# Patient Record
Sex: Female | Born: 1985
Health system: Southern US, Community
[De-identification: ages and names within clinical notes are randomized; demographics above are authoritative.]

## PROBLEM LIST (undated history)

## (undated) DIAGNOSIS — Z8611 Personal history of tuberculosis: Secondary | ICD-10-CM

## (undated) DIAGNOSIS — N838 Other noninflammatory disorders of ovary, fallopian tube and broad ligament: Secondary | ICD-10-CM

## (undated) DIAGNOSIS — N939 Abnormal uterine and vaginal bleeding, unspecified: Secondary | ICD-10-CM

## (undated) DIAGNOSIS — L910 Hypertrophic scar: Secondary | ICD-10-CM

## (undated) HISTORY — DX: Personal history of tuberculosis: Z86.11

## (undated) HISTORY — DX: Abnormal uterine and vaginal bleeding, unspecified: N93.9

## (undated) HISTORY — DX: Other noninflammatory disorders of ovary, fallopian tube and broad ligament: N83.8

---

## 2015-02-25 ENCOUNTER — Ambulatory Visit
Admission: RE | Admit: 2015-02-25 | Discharge: 2015-02-25 | Disposition: A | Discharge status: Home / Self Care | Payer: No Typology Code available for payment source | Source: Ambulatory Visit | Attending: Infectious Disease | Admitting: Infectious Disease

## 2015-02-25 ENCOUNTER — Other Ambulatory Visit: Payer: Self-pay | Admitting: Infectious Disease

## 2015-02-25 DIAGNOSIS — Z139 Encounter for screening, unspecified: Secondary | ICD-10-CM

## 2015-09-06 DIAGNOSIS — D069 Carcinoma in situ of cervix, unspecified: Secondary | ICD-10-CM

## 2015-09-06 HISTORY — DX: Carcinoma in situ of cervix, unspecified: D06.9

## 2015-09-23 HISTORY — PX: LEEP: SHX91

## 2018-10-03 DIAGNOSIS — B373 Candidiasis of vulva and vagina: Secondary | ICD-10-CM | POA: Diagnosis not present

## 2018-10-03 DIAGNOSIS — Z113 Encounter for screening for infections with a predominantly sexual mode of transmission: Secondary | ICD-10-CM | POA: Diagnosis not present

## 2018-10-03 DIAGNOSIS — M545 Low back pain: Secondary | ICD-10-CM | POA: Diagnosis not present

## 2018-10-03 DIAGNOSIS — N760 Acute vaginitis: Secondary | ICD-10-CM | POA: Diagnosis not present

## 2018-10-22 DIAGNOSIS — L910 Hypertrophic scar: Secondary | ICD-10-CM | POA: Diagnosis not present

## 2018-11-19 DIAGNOSIS — L910 Hypertrophic scar: Secondary | ICD-10-CM | POA: Diagnosis not present

## 2018-11-28 DIAGNOSIS — M545 Low back pain: Secondary | ICD-10-CM | POA: Diagnosis not present

## 2018-11-28 DIAGNOSIS — N925 Other specified irregular menstruation: Secondary | ICD-10-CM | POA: Diagnosis not present

## 2018-12-10 DIAGNOSIS — Z6828 Body mass index (BMI) 28.0-28.9, adult: Secondary | ICD-10-CM | POA: Diagnosis not present

## 2018-12-10 DIAGNOSIS — N926 Irregular menstruation, unspecified: Secondary | ICD-10-CM | POA: Diagnosis not present

## 2018-12-10 DIAGNOSIS — L910 Hypertrophic scar: Secondary | ICD-10-CM | POA: Diagnosis not present

## 2018-12-10 DIAGNOSIS — M545 Low back pain: Secondary | ICD-10-CM | POA: Diagnosis not present

## 2018-12-24 DIAGNOSIS — N926 Irregular menstruation, unspecified: Secondary | ICD-10-CM | POA: Diagnosis not present

## 2018-12-24 DIAGNOSIS — E039 Hypothyroidism, unspecified: Secondary | ICD-10-CM | POA: Diagnosis not present

## 2018-12-29 DIAGNOSIS — Z23 Encounter for immunization: Secondary | ICD-10-CM | POA: Diagnosis not present

## 2018-12-29 DIAGNOSIS — L910 Hypertrophic scar: Secondary | ICD-10-CM | POA: Diagnosis not present

## 2019-01-23 DIAGNOSIS — Z0000 Encounter for general adult medical examination without abnormal findings: Secondary | ICD-10-CM | POA: Diagnosis not present

## 2019-01-26 DIAGNOSIS — L910 Hypertrophic scar: Secondary | ICD-10-CM | POA: Diagnosis not present

## 2019-01-28 DIAGNOSIS — M549 Dorsalgia, unspecified: Secondary | ICD-10-CM | POA: Diagnosis not present

## 2019-02-04 DIAGNOSIS — M6289 Other specified disorders of muscle: Secondary | ICD-10-CM | POA: Diagnosis not present

## 2019-02-04 DIAGNOSIS — M6281 Muscle weakness (generalized): Secondary | ICD-10-CM | POA: Diagnosis not present

## 2019-02-04 DIAGNOSIS — M549 Dorsalgia, unspecified: Secondary | ICD-10-CM | POA: Diagnosis not present

## 2019-02-04 DIAGNOSIS — Z5189 Encounter for other specified aftercare: Secondary | ICD-10-CM | POA: Diagnosis not present

## 2019-02-18 DIAGNOSIS — M9903 Segmental and somatic dysfunction of lumbar region: Secondary | ICD-10-CM | POA: Diagnosis not present

## 2019-02-18 DIAGNOSIS — M9904 Segmental and somatic dysfunction of sacral region: Secondary | ICD-10-CM | POA: Diagnosis not present

## 2019-02-18 DIAGNOSIS — M5117 Intervertebral disc disorders with radiculopathy, lumbosacral region: Secondary | ICD-10-CM | POA: Diagnosis not present

## 2019-02-18 DIAGNOSIS — M9906 Segmental and somatic dysfunction of lower extremity: Secondary | ICD-10-CM | POA: Diagnosis not present

## 2019-02-18 DIAGNOSIS — M5416 Radiculopathy, lumbar region: Secondary | ICD-10-CM | POA: Diagnosis not present

## 2019-02-25 DIAGNOSIS — M5117 Intervertebral disc disorders with radiculopathy, lumbosacral region: Secondary | ICD-10-CM | POA: Diagnosis not present

## 2019-02-25 DIAGNOSIS — M5416 Radiculopathy, lumbar region: Secondary | ICD-10-CM | POA: Diagnosis not present

## 2019-02-25 DIAGNOSIS — M9904 Segmental and somatic dysfunction of sacral region: Secondary | ICD-10-CM | POA: Diagnosis not present

## 2019-02-25 DIAGNOSIS — M9906 Segmental and somatic dysfunction of lower extremity: Secondary | ICD-10-CM | POA: Diagnosis not present

## 2019-02-25 DIAGNOSIS — M9903 Segmental and somatic dysfunction of lumbar region: Secondary | ICD-10-CM | POA: Diagnosis not present

## 2019-02-26 DIAGNOSIS — L910 Hypertrophic scar: Secondary | ICD-10-CM | POA: Diagnosis not present

## 2019-02-26 DIAGNOSIS — S37099A Other injury of unspecified kidney, initial encounter: Secondary | ICD-10-CM | POA: Diagnosis not present

## 2019-03-04 DIAGNOSIS — M9906 Segmental and somatic dysfunction of lower extremity: Secondary | ICD-10-CM | POA: Diagnosis not present

## 2019-03-04 DIAGNOSIS — M5117 Intervertebral disc disorders with radiculopathy, lumbosacral region: Secondary | ICD-10-CM | POA: Diagnosis not present

## 2019-03-04 DIAGNOSIS — M9903 Segmental and somatic dysfunction of lumbar region: Secondary | ICD-10-CM | POA: Diagnosis not present

## 2019-03-04 DIAGNOSIS — M5416 Radiculopathy, lumbar region: Secondary | ICD-10-CM | POA: Diagnosis not present

## 2019-03-04 DIAGNOSIS — M9904 Segmental and somatic dysfunction of sacral region: Secondary | ICD-10-CM | POA: Diagnosis not present

## 2019-03-11 DIAGNOSIS — M5117 Intervertebral disc disorders with radiculopathy, lumbosacral region: Secondary | ICD-10-CM | POA: Diagnosis not present

## 2019-03-11 DIAGNOSIS — M9903 Segmental and somatic dysfunction of lumbar region: Secondary | ICD-10-CM | POA: Diagnosis not present

## 2019-03-11 DIAGNOSIS — M5416 Radiculopathy, lumbar region: Secondary | ICD-10-CM | POA: Diagnosis not present

## 2019-03-11 DIAGNOSIS — M9904 Segmental and somatic dysfunction of sacral region: Secondary | ICD-10-CM | POA: Diagnosis not present

## 2019-03-11 DIAGNOSIS — M9906 Segmental and somatic dysfunction of lower extremity: Secondary | ICD-10-CM | POA: Diagnosis not present

## 2019-03-18 DIAGNOSIS — M5416 Radiculopathy, lumbar region: Secondary | ICD-10-CM | POA: Diagnosis not present

## 2019-03-18 DIAGNOSIS — M9903 Segmental and somatic dysfunction of lumbar region: Secondary | ICD-10-CM | POA: Diagnosis not present

## 2019-03-18 DIAGNOSIS — M9904 Segmental and somatic dysfunction of sacral region: Secondary | ICD-10-CM | POA: Diagnosis not present

## 2019-03-18 DIAGNOSIS — M5117 Intervertebral disc disorders with radiculopathy, lumbosacral region: Secondary | ICD-10-CM | POA: Diagnosis not present

## 2019-03-18 DIAGNOSIS — M9906 Segmental and somatic dysfunction of lower extremity: Secondary | ICD-10-CM | POA: Diagnosis not present

## 2019-04-01 DIAGNOSIS — M9906 Segmental and somatic dysfunction of lower extremity: Secondary | ICD-10-CM | POA: Diagnosis not present

## 2019-04-01 DIAGNOSIS — M5117 Intervertebral disc disorders with radiculopathy, lumbosacral region: Secondary | ICD-10-CM | POA: Diagnosis not present

## 2019-04-01 DIAGNOSIS — M9903 Segmental and somatic dysfunction of lumbar region: Secondary | ICD-10-CM | POA: Diagnosis not present

## 2019-04-01 DIAGNOSIS — M5416 Radiculopathy, lumbar region: Secondary | ICD-10-CM | POA: Diagnosis not present

## 2019-04-01 DIAGNOSIS — M9904 Segmental and somatic dysfunction of sacral region: Secondary | ICD-10-CM | POA: Diagnosis not present

## 2019-04-08 DIAGNOSIS — M9904 Segmental and somatic dysfunction of sacral region: Secondary | ICD-10-CM | POA: Diagnosis not present

## 2019-04-08 DIAGNOSIS — M9903 Segmental and somatic dysfunction of lumbar region: Secondary | ICD-10-CM | POA: Diagnosis not present

## 2019-04-08 DIAGNOSIS — M5117 Intervertebral disc disorders with radiculopathy, lumbosacral region: Secondary | ICD-10-CM | POA: Diagnosis not present

## 2019-04-08 DIAGNOSIS — M5416 Radiculopathy, lumbar region: Secondary | ICD-10-CM | POA: Diagnosis not present

## 2019-04-08 DIAGNOSIS — M9906 Segmental and somatic dysfunction of lower extremity: Secondary | ICD-10-CM | POA: Diagnosis not present

## 2019-04-15 DIAGNOSIS — M5416 Radiculopathy, lumbar region: Secondary | ICD-10-CM | POA: Diagnosis not present

## 2019-04-15 DIAGNOSIS — M9903 Segmental and somatic dysfunction of lumbar region: Secondary | ICD-10-CM | POA: Diagnosis not present

## 2019-04-15 DIAGNOSIS — M9906 Segmental and somatic dysfunction of lower extremity: Secondary | ICD-10-CM | POA: Diagnosis not present

## 2019-04-15 DIAGNOSIS — M5117 Intervertebral disc disorders with radiculopathy, lumbosacral region: Secondary | ICD-10-CM | POA: Diagnosis not present

## 2019-04-15 DIAGNOSIS — M9904 Segmental and somatic dysfunction of sacral region: Secondary | ICD-10-CM | POA: Diagnosis not present

## 2019-04-22 DIAGNOSIS — M9903 Segmental and somatic dysfunction of lumbar region: Secondary | ICD-10-CM | POA: Diagnosis not present

## 2019-04-22 DIAGNOSIS — M5416 Radiculopathy, lumbar region: Secondary | ICD-10-CM | POA: Diagnosis not present

## 2019-04-22 DIAGNOSIS — M9904 Segmental and somatic dysfunction of sacral region: Secondary | ICD-10-CM | POA: Diagnosis not present

## 2019-04-22 DIAGNOSIS — M5117 Intervertebral disc disorders with radiculopathy, lumbosacral region: Secondary | ICD-10-CM | POA: Diagnosis not present

## 2019-04-22 DIAGNOSIS — M9906 Segmental and somatic dysfunction of lower extremity: Secondary | ICD-10-CM | POA: Diagnosis not present

## 2019-04-29 DIAGNOSIS — M9906 Segmental and somatic dysfunction of lower extremity: Secondary | ICD-10-CM | POA: Diagnosis not present

## 2019-04-29 DIAGNOSIS — M5416 Radiculopathy, lumbar region: Secondary | ICD-10-CM | POA: Diagnosis not present

## 2019-04-29 DIAGNOSIS — M9903 Segmental and somatic dysfunction of lumbar region: Secondary | ICD-10-CM | POA: Diagnosis not present

## 2019-04-29 DIAGNOSIS — M5117 Intervertebral disc disorders with radiculopathy, lumbosacral region: Secondary | ICD-10-CM | POA: Diagnosis not present

## 2019-04-29 DIAGNOSIS — M9904 Segmental and somatic dysfunction of sacral region: Secondary | ICD-10-CM | POA: Diagnosis not present

## 2019-05-11 DIAGNOSIS — L910 Hypertrophic scar: Secondary | ICD-10-CM | POA: Diagnosis not present

## 2019-05-11 DIAGNOSIS — S37091A Other injury of right kidney, initial encounter: Secondary | ICD-10-CM | POA: Diagnosis not present

## 2019-06-17 DIAGNOSIS — M5416 Radiculopathy, lumbar region: Secondary | ICD-10-CM | POA: Diagnosis not present

## 2019-06-17 DIAGNOSIS — M5117 Intervertebral disc disorders with radiculopathy, lumbosacral region: Secondary | ICD-10-CM | POA: Diagnosis not present

## 2019-06-17 DIAGNOSIS — M9903 Segmental and somatic dysfunction of lumbar region: Secondary | ICD-10-CM | POA: Diagnosis not present

## 2019-06-17 DIAGNOSIS — M9904 Segmental and somatic dysfunction of sacral region: Secondary | ICD-10-CM | POA: Diagnosis not present

## 2019-06-17 DIAGNOSIS — M9906 Segmental and somatic dysfunction of lower extremity: Secondary | ICD-10-CM | POA: Diagnosis not present

## 2019-06-22 DIAGNOSIS — M5416 Radiculopathy, lumbar region: Secondary | ICD-10-CM | POA: Diagnosis not present

## 2019-06-22 DIAGNOSIS — M9904 Segmental and somatic dysfunction of sacral region: Secondary | ICD-10-CM | POA: Diagnosis not present

## 2019-06-22 DIAGNOSIS — M9903 Segmental and somatic dysfunction of lumbar region: Secondary | ICD-10-CM | POA: Diagnosis not present

## 2019-06-22 DIAGNOSIS — M9906 Segmental and somatic dysfunction of lower extremity: Secondary | ICD-10-CM | POA: Diagnosis not present

## 2019-06-22 DIAGNOSIS — M5117 Intervertebral disc disorders with radiculopathy, lumbosacral region: Secondary | ICD-10-CM | POA: Diagnosis not present

## 2019-06-24 DIAGNOSIS — L910 Hypertrophic scar: Secondary | ICD-10-CM | POA: Diagnosis not present

## 2019-06-29 DIAGNOSIS — M5117 Intervertebral disc disorders with radiculopathy, lumbosacral region: Secondary | ICD-10-CM | POA: Diagnosis not present

## 2019-06-29 DIAGNOSIS — M9906 Segmental and somatic dysfunction of lower extremity: Secondary | ICD-10-CM | POA: Diagnosis not present

## 2019-06-29 DIAGNOSIS — M9903 Segmental and somatic dysfunction of lumbar region: Secondary | ICD-10-CM | POA: Diagnosis not present

## 2019-06-29 DIAGNOSIS — M9904 Segmental and somatic dysfunction of sacral region: Secondary | ICD-10-CM | POA: Diagnosis not present

## 2019-06-29 DIAGNOSIS — M5416 Radiculopathy, lumbar region: Secondary | ICD-10-CM | POA: Diagnosis not present

## 2019-08-27 DIAGNOSIS — L910 Hypertrophic scar: Secondary | ICD-10-CM | POA: Diagnosis not present

## 2019-09-24 DIAGNOSIS — L910 Hypertrophic scar: Secondary | ICD-10-CM | POA: Diagnosis not present

## 2019-10-16 DIAGNOSIS — M25551 Pain in right hip: Secondary | ICD-10-CM | POA: Diagnosis not present

## 2019-10-16 DIAGNOSIS — M5417 Radiculopathy, lumbosacral region: Secondary | ICD-10-CM | POA: Diagnosis not present

## 2019-10-21 DIAGNOSIS — M545 Low back pain: Secondary | ICD-10-CM | POA: Diagnosis not present

## 2019-10-24 DIAGNOSIS — M545 Low back pain: Secondary | ICD-10-CM | POA: Diagnosis not present

## 2019-10-27 ENCOUNTER — Other Ambulatory Visit: Payer: Self-pay | Admitting: Family Medicine

## 2019-10-27 DIAGNOSIS — M545 Low back pain, unspecified: Secondary | ICD-10-CM

## 2019-10-30 ENCOUNTER — Ambulatory Visit
Admission: RE | Admit: 2019-10-30 | Discharge: 2019-10-30 | Disposition: A | Discharge status: Home / Self Care | Payer: BC Managed Care – PPO | Source: Ambulatory Visit | Attending: Family Medicine | Admitting: Family Medicine

## 2019-10-30 DIAGNOSIS — M545 Low back pain, unspecified: Secondary | ICD-10-CM

## 2019-10-30 DIAGNOSIS — N83201 Unspecified ovarian cyst, right side: Secondary | ICD-10-CM | POA: Diagnosis not present

## 2019-10-30 DIAGNOSIS — J479 Bronchiectasis, uncomplicated: Secondary | ICD-10-CM | POA: Diagnosis not present

## 2019-10-30 DIAGNOSIS — Q631 Lobulated, fused and horseshoe kidney: Secondary | ICD-10-CM | POA: Diagnosis not present

## 2019-10-30 DIAGNOSIS — N838 Other noninflammatory disorders of ovary, fallopian tube and broad ligament: Secondary | ICD-10-CM | POA: Diagnosis not present

## 2019-10-30 MED ORDER — IOPAMIDOL (ISOVUE-300) INJECTION 61%
100.0000 mL | Freq: Once | INTRAVENOUS | Status: AC | PRN
  Administered 2019-10-30: 100 mL via INTRAVENOUS

## 2019-11-06 ENCOUNTER — Other Ambulatory Visit: Payer: Self-pay

## 2019-11-06 DIAGNOSIS — L910 Hypertrophic scar: Secondary | ICD-10-CM | POA: Diagnosis not present

## 2019-11-11 DIAGNOSIS — M545 Low back pain, unspecified: Secondary | ICD-10-CM | POA: Diagnosis not present

## 2019-11-24 DIAGNOSIS — N926 Irregular menstruation, unspecified: Secondary | ICD-10-CM | POA: Diagnosis not present

## 2019-11-24 DIAGNOSIS — N83292 Other ovarian cyst, left side: Secondary | ICD-10-CM | POA: Diagnosis not present

## 2019-12-02 DIAGNOSIS — R9341 Abnormal radiologic findings on diagnostic imaging of renal pelvis, ureter, or bladder: Secondary | ICD-10-CM | POA: Diagnosis not present

## 2019-12-04 DIAGNOSIS — L910 Hypertrophic scar: Secondary | ICD-10-CM | POA: Diagnosis not present

## 2019-12-15 DIAGNOSIS — N838 Other noninflammatory disorders of ovary, fallopian tube and broad ligament: Secondary | ICD-10-CM | POA: Diagnosis not present

## 2019-12-15 DIAGNOSIS — N83209 Unspecified ovarian cyst, unspecified side: Secondary | ICD-10-CM | POA: Diagnosis not present

## 2019-12-15 DIAGNOSIS — E039 Hypothyroidism, unspecified: Secondary | ICD-10-CM | POA: Diagnosis not present

## 2019-12-16 DIAGNOSIS — M545 Low back pain, unspecified: Secondary | ICD-10-CM | POA: Diagnosis not present

## 2019-12-16 DIAGNOSIS — M25551 Pain in right hip: Secondary | ICD-10-CM | POA: Diagnosis not present

## 2019-12-16 DIAGNOSIS — Z1329 Encounter for screening for other suspected endocrine disorder: Secondary | ICD-10-CM | POA: Diagnosis not present

## 2019-12-18 DIAGNOSIS — R772 Abnormality of alphafetoprotein: Secondary | ICD-10-CM

## 2019-12-18 DIAGNOSIS — N838 Other noninflammatory disorders of ovary, fallopian tube and broad ligament: Secondary | ICD-10-CM

## 2019-12-21 ENCOUNTER — Encounter: Payer: Self-pay | Admitting: Gynecologic Oncology

## 2019-12-21 ENCOUNTER — Inpatient Hospital Stay (HOSPITAL_BASED_OUTPATIENT_CLINIC_OR_DEPARTMENT_OTHER): Payer: BC Managed Care – PPO | Admitting: Gynecologic Oncology

## 2019-12-21 ENCOUNTER — Other Ambulatory Visit (HOSPITAL_COMMUNITY)
Admission: RE | Admit: 2019-12-21 | Discharge: 2019-12-21 | Disposition: A | Discharge status: Home / Self Care | Payer: BC Managed Care – PPO | Source: Ambulatory Visit | Attending: Gynecologic Oncology | Admitting: Gynecologic Oncology

## 2019-12-21 ENCOUNTER — Inpatient Hospital Stay: Payer: BC Managed Care – PPO | Attending: Gynecologic Oncology

## 2019-12-21 ENCOUNTER — Other Ambulatory Visit: Payer: Self-pay

## 2019-12-21 VITALS — BP 125/78 | HR 95 | Temp 95.3°F | Resp 18 | Ht 64.0 in | Wt 164.0 lb

## 2019-12-21 DIAGNOSIS — M549 Dorsalgia, unspecified: Secondary | ICD-10-CM

## 2019-12-21 DIAGNOSIS — Z124 Encounter for screening for malignant neoplasm of cervix: Secondary | ICD-10-CM | POA: Insufficient documentation

## 2019-12-21 DIAGNOSIS — N838 Other noninflammatory disorders of ovary, fallopian tube and broad ligament: Secondary | ICD-10-CM | POA: Diagnosis not present

## 2019-12-21 DIAGNOSIS — R772 Abnormality of alphafetoprotein: Secondary | ICD-10-CM | POA: Insufficient documentation

## 2019-12-21 DIAGNOSIS — Z8611 Personal history of tuberculosis: Secondary | ICD-10-CM

## 2019-12-21 DIAGNOSIS — R102 Pelvic and perineal pain: Secondary | ICD-10-CM

## 2019-12-21 DIAGNOSIS — D3911 Neoplasm of uncertain behavior of right ovary: Secondary | ICD-10-CM

## 2019-12-21 DIAGNOSIS — G8929 Other chronic pain: Secondary | ICD-10-CM | POA: Insufficient documentation

## 2019-12-21 LAB — LACTATE DEHYDROGENASE: LDH: 191 U/L (ref 98–192)

## 2019-12-21 NOTE — Patient Instructions (Signed)
I will call you with the lab tests from today as well as your pap test once those results are back.  We will send a referral requesting a pelvic ultrasound to the clinic that you designate in [place] while you are there for the holidays.  I'd like to talk again after you are back (and hopefully can drop off the records from your ultrasound) regarding your desire to move forward with pregnancy based on any possible change in the mass on your right ovary on your follow-up ultrasound.  If you have any questions, please call the clinic at [phone].

## 2019-12-21 NOTE — Progress Notes (Signed)
GYNECOLOGIC ONCOLOGY NEW PATIENT CONSULTATION   Patient Name: Monica Bradley  Patient Age: 34 y.o. Date of Service: 12/21/19 Referring Provider: Dr. Murrell Redden  Primary Care Provider: Patient, No Pcp Per Consulting Provider: Jeral Pinch, MD   Assessment/Plan:  Premenopausal patient with elevated AFP and small solid-appearing adnexal mass.  We discussed findings on her ultrasound and CT scan. She has a solid-appearing nodule within the right ovary on recent ultrasound, also seen on her CT scan. Given the size of this lesion (less than 2 cm), this would not typically be responsible for the back and pelvic pain that she has been having. We reviewed the features on ultrasound that raise concern for the possibility of a malignant process, especially in the setting of an elevated AFP. AFP is a tumor marker that can be elevated in germ cell tumors and, less commonly, Sertoli-Leydig tumors. The patient expressed some concern with the lab test from her recent visit. She has had her TSH checked twice at this office and it has been found to be elevated. Close follow-up with her PCP found the TSH to be normal. She request that the AFP be repeated. I will plan to also get an LDH today.  Surgery to excise this lesion, ideally with a cystectomy, would offer the only way to definitively rule out a malignant process. We discussed that this could be done robotically and the mass could be sent for frozen section to help guide additional treatment. Even in the setting of a malignancy, often, fertility-sparing staging can be performed. We discussed that this may entail lymph node sampling, omentectomy, and peritoneal biopsies. Depending on the appearance of the the right ovary, and my ability to safely perform cystectomy, removal of the entire ovary may be necessary or indicated. During this discussion, the patient adamantly opposed to removal of her ovary. While I offered cystectomy with the understanding of risk of  dissemination if this is a malignant process, I cannot assure without any doubt that the entire ovary would not be removed. There is always a risk in the setting of cystectomy that bleeding from the ovarian parenchyma may require removal of that ovary. Given her very strong desires to become pregnant in the near future, the patient does not want to risk losing 1 ovary. We discussed that many patients are able to achieve pregnancy with one ovary in situ.  We will plan to repeat her AFP and get an LDH today. If her AFP is in normal range, I will have very low suspicion for malignancy. I recommend regardless of the AFP, given the patient's reluctance to move forward with surgery, that we repeat pelvic ultrasound in 6-8 weeks. The patient is going to be traveling to [place] for the holidays, where she has some family. She will provide [place] with the name and number of a clinic where she has received OB/GYN care before so that we may send a referral to hopefully get an ultrasound scheduled while she is there. I have asked her to bring a copy of the ultrasound report as well as images, if possible, and to drop these off when she returns to [place]. If the mass has grown and/or looks more concerning, I will recommend surgery. If stable, the patient's preference is to attempt pregnancy. She is willing to defer attempting pregnancy until we get the results of a repeat ultrasound.  Given her history of cervical dysplasia with prior excisional procedure, Pap test and HPV testing performed today.  A copy of this note was  sent to the patient's referring provider.   65 minutes of total time was spent for this patient encounter, including preparation, face-to-face counseling with the patient and coordination of care, and documentation of the encounter.  Jeral Pinch, MD  Division of Gynecologic Oncology  Department of Obstetrics and Gynecology  [facility]   ___________________________________________  Chief Complaint: Chief Complaint  Patient presents with  . Ovarian mass, right  . Elevated AFP    History of Present Illness:  Monica Bradley is a 34 y.o. y.o. female who is seen in consultation at the request of Dr. Murrell Redden for an evaluation of almost a 2-year history of right-sided back and pelvic pain now with findings of a small solid right adnexal mass.  Patient reports that beginning in February 2020, shortly after a miscarriage (TAB, treated with medication), she began having right sided low back pain. She would have difficulty standing for more than 5 minutes. She additionally endorsed feeling pelvic pressure. She had virtual physical therapy and saw a chiropractor, which helped with her back pain and with walking. Now, she notes feeling "pressure" or "pinching" when she is sitting. If she sits for more than 15 minutes, she feels that her pelvis "gets inflamed". Sometimes she has right-sided back and flank pain, sometimes the pain is in her right pelvis anteriorly. When she wakes up in the morning, this area often feels "tight". She sometimes struggles to put on her shoes. Stretching seems to help. [facility] and given ibuprofen and a muscle relaxer. These medications help with some movements, especially turning, but the symptoms are still present. At the time that the pain started in early 2020, her menses after the miscarriage was abnormal. She would have longer length of time between menses. Her menses normalized after she stopped some of the medications that she was taken. Her pain continues to be worse during her menses. She is a sexually active at this time intermittently and denies any dyspareunia.  She reports having a good appetite without nausea or emesis. She reports normal bowel and bladder function.  She has a history of high-grade cervical dysplasia treated with a LEEP in 2017. Her 78-month  follow-up Pap smear after the procedure was normal. Her second and last Pap smear was in 2019 and normal per her report.  PAST MEDICAL HISTORY:  Past Medical History:  Diagnosis Date  . Abnormal uterine bleeding (AUB)   . CIN III (cervical intraepithelial neoplasia III) 09/2015  . History of TB (tuberculosis)   . Ovarian mass, right      PAST SURGICAL HISTORY:  Past Surgical History:  Procedure Laterality Date  . LEEP  09/23/2015    OB/GYN HISTORY:  OB History  Gravida Para Term Preterm AB Living  2       2    SAB TAB Ectopic Multiple Live Births               # Outcome Date GA Lbr Len/2nd Weight Sex Delivery Anes PTL Lv  2 AB           1 AB             No LMP recorded.  Age at menarche: 25 Age at menopause: n/a Last pap: 2019 History of abnormal pap smears: yes, see HPI  SCREENING STUDIES:  Last mammogram: n/a  Last colonoscopy: n/a  MEDICATIONS: Outpatient Encounter Medications as of 12/21/2019  Medication Sig  . Prenatal Vit-Fe Fumarate-FA (MULTIVITAMIN-PRENATAL)  27-0.8 MG TABS tablet Take 1 tablet by mouth daily at 12 noon.  . folic acid (FOLVITE) 1 MG tablet Take 1 mg by mouth daily. (Patient not taking: Reported on 12/21/2019)   No facility-administered encounter medications on file as of 12/21/2019.    ALLERGIES:  No Known Allergies   FAMILY HISTORY:  Family History  Problem Relation Age of Onset  . Breast cancer Neg Hx   . Ovarian cancer Neg Hx   . Uterine cancer Neg Hx   . Colon cancer Neg Hx      SOCIAL HISTORY:    Social Connections:   . Frequency of Communication with Friends and Family: Not on file  . Frequency of Social Gatherings with Friends and Family: Not on file  . Attends Religious Services: Not on file  . Active Member of Clubs or Organizations: Not on file  . Attends Archivist Meetings: Not on file  . Marital Status: Not on file    REVIEW OF SYSTEMS:  Pertinent positives as per HPI. Denies appetite changes,  fevers, chills, fatigue, unexplained weight changes. Denies hearing loss, neck lumps or masses, mouth sores, ringing in ears or voice changes. Denies cough or wheezing.  Denies shortness of breath. Denies chest pain or palpitations. Denies leg swelling. Denies abdominal distention, blood in stools, constipation, diarrhea, nausea, vomiting, or early satiety. Denies pain with intercourse, dysuria, frequency, hematuria or incontinence. Denies hot flashes, vaginal bleeding or vaginal discharge.   Denies joint pain. Denies itching, rash, or wounds. Denies dizziness, headaches, numbness or seizures. Denies swollen lymph nodes or glands, denies easy bruising or bleeding. Denies anxiety, depression, confusion, or decreased concentration.  Physical Exam:  Vital Signs for this encounter:  Blood pressure 125/78, pulse 95, temperature (!) 95.3 F (35.2 C), temperature source Tympanic, resp. rate 18, height 5\' 4"  (1.626 m), weight 164 lb (74.4 kg), SpO2 100 %. Body mass index is 28.15 kg/m. General: Alert, oriented, no acute distress.  HEENT: Normocephalic, atraumatic. Sclera anicteric.  Chest: Unlabored breathing on room air. Abdomen: Normoactive bowel sounds. Soft, nondistended, nontender to palpation. No masses or hepatosplenomegaly appreciated. No palpable fluid wave.  Extremities: Grossly normal range of motion. Warm, well perfused. No edema bilaterally.  Skin: No rashes or lesions.  Lymphatics: No cervical, supraclavicular, or inguinal adenopathy.  GU:  Normal external female genitalia. No lesions. No discharge or bleeding.             Bladder/urethra:  No lesions or masses, well supported bladder             Vagina: Well rugated, no lesions or masses.             Cervix: Normal appearing, no lesions.  Pap test and HPV testing collected.             Uterus: Small, mobile, no parametrial involvement or nodularity.             Adnexa: No masses appreciated.  LABORATORY AND RADIOLOGIC DATA:   Outside medical records were reviewed to synthesize the above history, along with the history and physical obtained during the visit.   TSH on 10/19: 5.13  Tumor markers from 12/15/2019: CA-125: 19.9 bHCG: < 1 LDH: Not available AFP: 13.5 Estradiol: 194 Testosterone: 18 Inhibin A: 15.8  CT A/P on 9/24: 1. Possible mild bladder wall thickening. Correlate with urinalysis. 2. There is a 1.4 cm enhancing nodule in the right ovary. Given the patient's history of right-sided abdominal pain, follow-up with a pelvic  ultrasound is recommended. 3. 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 No hydronephrosis or radiopaque kidney stones. 4. Small volume of pelvic free fluid is likely physiologic. 5. Again noted is significant right lung volume loss as was previously described in 2017. 6. Questionable septate uterus. Attention on follow-up pelvic ultrasound is recommended.  Pelvic ultrasound exam on 11/9: Right ovary measures 2.8 x 2.4 x 1.7 cm with a solid-appearing lesion with internal vascularity measuring 1.5 x 1.4 x 1.6 cm.  Simple left ovarian cyst measuring up to 4.8 cm.

## 2019-12-22 ENCOUNTER — Telehealth: Payer: Self-pay | Admitting: Gynecologic Oncology

## 2019-12-22 ENCOUNTER — Other Ambulatory Visit: Payer: Self-pay | Admitting: Gynecologic Oncology

## 2019-12-22 DIAGNOSIS — N838 Other noninflammatory disorders of ovary, fallopian tube and broad ligament: Secondary | ICD-10-CM

## 2019-12-22 LAB — AFP TUMOR MARKER: AFP, Serum, Tumor Marker: 14.4 ng/mL — ABNORMAL HIGH (ref 0.0–8.3)

## 2019-12-22 NOTE — Telephone Encounter (Signed)
Called the patient to discuss lab test from yesterday.  LDH came back within normal limits.  AFP continues to be high, slightly increased from last value although this could be within lab error. I voiced my concerns that in the setting of a known adnexal mass, while not diagnostic, and increased AFP makes me concerned about a germ cell tumor.  The only way to definitively rule this out is surgically.  The patient understands my concern.  Her preference is still to wait on surgery and she would like to get a follow-up ultrasound in December while she is home in [place].  She will get these results to me when she is back in town and we will plan the next step based on a conversation once I have seen her follow-up ultrasound.  Jeral Pinch MD Gynecologic Oncology

## 2019-12-28 ENCOUNTER — Telehealth: Payer: Self-pay | Admitting: *Deleted

## 2019-12-28 LAB — CYTOLOGY - PAP: Diagnosis: NEGATIVE

## 2019-12-28 NOTE — Telephone Encounter (Signed)
Per patient request tried multiple attempts to reach [facility], left several messages with no return cal. Called the patient and explained the that you called back. Patient request that we try to have the scan done at [facility].

## 2019-12-29 ENCOUNTER — Telehealth: Payer: Self-pay

## 2019-12-29 NOTE — Telephone Encounter (Signed)
Per patient request called [facility], they don't take out of state orders. Per Dr Berline Lopes patient can have 6 weeks after last scan on 11/9. (patient to have done before and after her return)  Called and left the patient a message to call the office back

## 2019-12-29 NOTE — Telephone Encounter (Signed)
Pt notified about pap results: negative.  No questions or concerns voiced. 

## 2019-12-29 NOTE — Telephone Encounter (Signed)
Called the patient and explained that [facility] will not take out of state orders. Explained that the office will continue to look for a place for the scan, and asked the patient do the same.

## 2019-12-30 NOTE — Telephone Encounter (Signed)
Patient called back and requested to alaso have her lab work redrawn in [place]. Explained that the doctor will be back on Monday and I will call her once the orders are ready for pick up

## 2019-12-30 NOTE — Telephone Encounter (Signed)
Patient called back and stated "I called [facility] and they can do the [place]. They just need me to have the signed order with me." Explained that the order will be at the front desk for her.

## 2020-01-04 ENCOUNTER — Other Ambulatory Visit: Payer: Self-pay | Admitting: Gynecologic Oncology

## 2020-01-04 ENCOUNTER — Telehealth: Payer: Self-pay | Admitting: *Deleted

## 2020-01-04 DIAGNOSIS — N838 Other noninflammatory disorders of ovary, fallopian tube and broad ligament: Secondary | ICD-10-CM

## 2020-01-04 DIAGNOSIS — R772 Abnormality of alphafetoprotein: Secondary | ICD-10-CM

## 2020-01-04 NOTE — Telephone Encounter (Signed)
Notified the patient that Dr Berline Lopes gave the order to have lab draw in [place]. Orders at front desk for pick up

## 2020-01-06 DIAGNOSIS — Z23 Encounter for immunization: Secondary | ICD-10-CM | POA: Diagnosis not present

## 2020-01-07 DIAGNOSIS — L910 Hypertrophic scar: Secondary | ICD-10-CM | POA: Diagnosis not present

## 2020-01-08 ENCOUNTER — Telehealth: Payer: Self-pay

## 2020-01-08 DIAGNOSIS — F439 Reaction to severe stress, unspecified: Secondary | ICD-10-CM | POA: Diagnosis not present

## 2020-01-08 NOTE — Telephone Encounter (Signed)
Told Monica Bradley that the labs AFP tumor marker, LDH, and Pap Smear results will be up front in a white envevelope with her name on it to pick up.

## 2020-01-13 DIAGNOSIS — M5416 Radiculopathy, lumbar region: Secondary | ICD-10-CM | POA: Diagnosis not present

## 2020-01-13 DIAGNOSIS — M9906 Segmental and somatic dysfunction of lower extremity: Secondary | ICD-10-CM | POA: Diagnosis not present

## 2020-01-13 DIAGNOSIS — M9904 Segmental and somatic dysfunction of sacral region: Secondary | ICD-10-CM | POA: Diagnosis not present

## 2020-01-13 DIAGNOSIS — M9903 Segmental and somatic dysfunction of lumbar region: Secondary | ICD-10-CM | POA: Diagnosis not present

## 2020-01-13 DIAGNOSIS — M5117 Intervertebral disc disorders with radiculopathy, lumbosacral region: Secondary | ICD-10-CM | POA: Diagnosis not present

## 2020-01-18 DIAGNOSIS — M9903 Segmental and somatic dysfunction of lumbar region: Secondary | ICD-10-CM | POA: Diagnosis not present

## 2020-01-18 DIAGNOSIS — M9904 Segmental and somatic dysfunction of sacral region: Secondary | ICD-10-CM | POA: Diagnosis not present

## 2020-01-18 DIAGNOSIS — M5117 Intervertebral disc disorders with radiculopathy, lumbosacral region: Secondary | ICD-10-CM | POA: Diagnosis not present

## 2020-01-18 DIAGNOSIS — M5416 Radiculopathy, lumbar region: Secondary | ICD-10-CM | POA: Diagnosis not present

## 2020-01-21 DIAGNOSIS — L910 Hypertrophic scar: Secondary | ICD-10-CM | POA: Diagnosis not present

## 2020-01-22 DIAGNOSIS — N838 Other noninflammatory disorders of ovary, fallopian tube and broad ligament: Secondary | ICD-10-CM | POA: Diagnosis not present

## 2020-01-25 DIAGNOSIS — M9903 Segmental and somatic dysfunction of lumbar region: Secondary | ICD-10-CM | POA: Diagnosis not present

## 2020-01-25 DIAGNOSIS — M9904 Segmental and somatic dysfunction of sacral region: Secondary | ICD-10-CM | POA: Diagnosis not present

## 2020-01-25 DIAGNOSIS — M9906 Segmental and somatic dysfunction of lower extremity: Secondary | ICD-10-CM | POA: Diagnosis not present

## 2020-01-25 DIAGNOSIS — M5117 Intervertebral disc disorders with radiculopathy, lumbosacral region: Secondary | ICD-10-CM | POA: Diagnosis not present

## 2020-01-25 DIAGNOSIS — M5416 Radiculopathy, lumbar region: Secondary | ICD-10-CM | POA: Diagnosis not present

## 2020-02-01 DIAGNOSIS — N83209 Unspecified ovarian cyst, unspecified side: Secondary | ICD-10-CM | POA: Diagnosis not present

## 2020-02-03 DIAGNOSIS — M5416 Radiculopathy, lumbar region: Secondary | ICD-10-CM | POA: Diagnosis not present

## 2020-02-03 DIAGNOSIS — M9904 Segmental and somatic dysfunction of sacral region: Secondary | ICD-10-CM | POA: Diagnosis not present

## 2020-02-03 DIAGNOSIS — M9906 Segmental and somatic dysfunction of lower extremity: Secondary | ICD-10-CM | POA: Diagnosis not present

## 2020-02-03 DIAGNOSIS — M9903 Segmental and somatic dysfunction of lumbar region: Secondary | ICD-10-CM | POA: Diagnosis not present

## 2020-02-03 DIAGNOSIS — M5117 Intervertebral disc disorders with radiculopathy, lumbosacral region: Secondary | ICD-10-CM | POA: Diagnosis not present

## 2020-02-10 DIAGNOSIS — M9906 Segmental and somatic dysfunction of lower extremity: Secondary | ICD-10-CM | POA: Diagnosis not present

## 2020-02-10 DIAGNOSIS — M9904 Segmental and somatic dysfunction of sacral region: Secondary | ICD-10-CM | POA: Diagnosis not present

## 2020-02-10 DIAGNOSIS — M5416 Radiculopathy, lumbar region: Secondary | ICD-10-CM | POA: Diagnosis not present

## 2020-02-10 DIAGNOSIS — M9903 Segmental and somatic dysfunction of lumbar region: Secondary | ICD-10-CM | POA: Diagnosis not present

## 2020-02-10 DIAGNOSIS — M5117 Intervertebral disc disorders with radiculopathy, lumbosacral region: Secondary | ICD-10-CM | POA: Diagnosis not present

## 2020-02-17 DIAGNOSIS — M9903 Segmental and somatic dysfunction of lumbar region: Secondary | ICD-10-CM | POA: Diagnosis not present

## 2020-02-17 DIAGNOSIS — M5416 Radiculopathy, lumbar region: Secondary | ICD-10-CM | POA: Diagnosis not present

## 2020-02-17 DIAGNOSIS — M5117 Intervertebral disc disorders with radiculopathy, lumbosacral region: Secondary | ICD-10-CM | POA: Diagnosis not present

## 2020-02-17 DIAGNOSIS — M9904 Segmental and somatic dysfunction of sacral region: Secondary | ICD-10-CM | POA: Diagnosis not present

## 2020-02-17 DIAGNOSIS — M9906 Segmental and somatic dysfunction of lower extremity: Secondary | ICD-10-CM | POA: Diagnosis not present

## 2020-02-18 ENCOUNTER — Inpatient Hospital Stay: Payer: BC Managed Care – PPO | Admitting: Gynecologic Oncology

## 2020-02-19 ENCOUNTER — Telehealth: Payer: Self-pay | Admitting: Gynecologic Oncology

## 2020-02-19 ENCOUNTER — Other Ambulatory Visit: Payer: Self-pay

## 2020-02-19 ENCOUNTER — Inpatient Hospital Stay: Payer: BC Managed Care – PPO | Attending: Gynecologic Oncology | Admitting: Gynecologic Oncology

## 2020-02-19 ENCOUNTER — Other Ambulatory Visit: Payer: Self-pay | Admitting: Gynecologic Oncology

## 2020-02-19 ENCOUNTER — Encounter: Payer: Self-pay | Admitting: Gynecologic Oncology

## 2020-02-19 DIAGNOSIS — N838 Other noninflammatory disorders of ovary, fallopian tube and broad ligament: Secondary | ICD-10-CM

## 2020-02-19 DIAGNOSIS — R772 Abnormality of alphafetoprotein: Secondary | ICD-10-CM

## 2020-02-19 DIAGNOSIS — N83201 Unspecified ovarian cyst, right side: Secondary | ICD-10-CM | POA: Diagnosis not present

## 2020-02-19 NOTE — Telephone Encounter (Signed)
Received records from [facility] in [place]  Pelvic ultrasound from 01/22/2020: Uterus anteverted measuring 6.7 x 3.6 x 4.4 cm, no obvious focal lesions visualized. Left adnexa: Left ovary measures 3.1 x 1.8 x 1.4 cm. Right adnexa: Right ovary measures 2.6 x 4.4 x 2.1 cm.  Simple appearing cyst seen within the right ovary representing either a dominant follicle or a partly cystic mass measuring 21 mm.  Additional hypoechoic solid-appearing 2 cm lesion is seen within the right ovary that could represent a hemorrhagic cyst or an involuting follicle. No pelvic free fluid noted. Impression: Simple appearing right ovarian/paraovarian cyst.  Additional hypoechoic lesion in the right ovary with differential as described above.  If warranted this can be followed with a 6-week ultrasound.  Alpha-fetoprotein on 12/16: 12.56

## 2020-02-19 NOTE — Progress Notes (Signed)
Gynecologic Oncology Telehealth Consult Note: Gyn-Onc  I connected with Monica Bradley on 02/19/20 at 12:30 PM EST by telephone and verified that I am speaking with the correct person using two identifiers.  I discussed the limitations, risks, security and privacy concerns of performing an evaluation and management service by telemedicine and the availability of in-person appointments. I also discussed with the patient that there may be a patient responsible charge related to this service. The patient expressed understanding and agreed to proceed.  Other persons participating in the visit and their role in the encounter: none.  Patient's location: [place] Provider's location: [facility]  Reason for Visit: f/u adnexal mass, increased AFP  Treatment History: Patient had a 2-year history of right-sided back and pelvic pain. CT on 10/30/2019: 1.4 cm enhancing nodule in the right ovary, small volume pelvic free fluid. Pelvic ultrasound on 12/15/2019:Right ovary measures 2.8 x 2.4 x 1.7 cm with a solid-appearing lesion with internal vascularity measuring 1.5 x 1.4 x 1.6 cm.  Simple left ovarian cyst measuring up to 4.8 cm. Tumor markers on 12/15/2019: Normal CA-125, hCG, estradiol, testosterone, and inhibin A.  AFP elevated at 13.5. Tumor markers on 12/21/2019: LDH normal, AFP 14.4.  Interval History: Patient presents today for a phone visit.  She is still in [place] and planning to return to [place] next week and start school again shortly after.  She is overall been doing well.  She is seeing a chiropractor for her back pain and has been diagnosed with pain related to her SI joint.  She is been going for adjustment and doing exercises and is almost pain-free now.  She denies recent right-sided pelvic pain.  She reports a good appetite without nausea or emesis.  She reports normal bowel and bladder function.  She is continue taking folic acid, and while she has been in [place], has been actively trying to  get pregnant.  Past Medical/Surgical History: Past Medical History:  Diagnosis Date  . Abnormal uterine bleeding (AUB)   . CIN III (cervical intraepithelial neoplasia III) 09/2015  . History of TB (tuberculosis)   . Ovarian mass, right     Past Surgical History:  Procedure Laterality Date  . LEEP  09/23/2015    Family History  Problem Relation Age of Onset  . Breast cancer Neg Hx   . Ovarian cancer Neg Hx   . Uterine cancer Neg Hx   . Colon cancer Neg Hx     Social History   Socioeconomic History  . Marital status: Single    Spouse name: Not on file  . Number of children: Not on file  . Years of education: Not on file  . Highest education level: Not on file  Occupational History  . Occupation: Ship broker  Tobacco Use  . Smoking status: Never Smoker  . Smokeless tobacco: Never Used  Substance and Sexual Activity  . Alcohol use: Never  . Drug use: Never  . Sexual activity: Not Currently  Other Topics Concern  . Not on file  Social History Narrative  . Not on file   Social Determinants of Health   Financial Resource Strain: Not on file  Food Insecurity: Not on file  Transportation Needs: Not on file  Physical Activity: Not on file  Stress: Not on file  Social Connections: Not on file    Current Medications:  Current Outpatient Medications:  .  folic acid (FOLVITE) 1 MG tablet, Take 1 mg by mouth daily. (Patient not taking: Reported on 12/21/2019), Disp: ,  Rfl:  .  Prenatal Vit-Fe Fumarate-FA (MULTIVITAMIN-PRENATAL) 27-0.8 MG TABS tablet, Take 1 tablet by mouth daily at 12 noon., Disp: , Rfl:   Review of Symptoms: Pertinent positives as per HPI, otherwise review of systems negative.  Physical Exam: There were no vitals taken for this visit. Deferred given limitations of phone visit.  Laboratory & Radiologic Studies: Pelvic ultrasound from 01/22/2020: Uterus anteverted measuring 6.7 x 3.6 x 4.4 cm, no obvious focal lesions visualized. Left adnexa: Left  ovary measures 3.1 x 1.8 x 1.4 cm. Right adnexa: Right ovary measures 2.6 x 4.4 x 2.1 cm.  Simple appearing cyst seen within the right ovary representing either a dominant follicle or a partly cystic mass measuring 21 mm.  Additional hypoechoic solid-appearing 2 cm lesion is seen within the right ovary that could represent a hemorrhagic cyst or an involuting follicle. No pelvic free fluid noted. Impression: Simple appearing right ovarian/paraovarian cyst.  Additional hypoechoic lesion in the right ovary with differential as described above.  If warranted this can be followed with a 6-week ultrasound.  Alpha-fetoprotein on 12/16: 12.56  Assessment & Plan: Monica Bradley is a 35 y.o. woman with right adnexal mass and elevated AFP presenting for phone discussion after repeat imaging and lab test while out of town for the holidays.  Overall, I am reassured by her repeat ultrasound as well as AFP level.  Although we discussed again that there can be some variation in lab test based on the facility where they are performed, her AFP has decreased since last checked.  Additionally, her pelvic ultrasound from mid December shows a simple appearing cystic lesion consistent with a follicle and a more solid-appearing lesion that was thought to represent a hemorrhagic cyst or involuting follicle.  While I am reassured by this finding, in the setting of her increased AFP, I recommend repeat imaging as well as another AFP level.  Patient returns to [facility] next week, and agreed to call the office to be scheduled for follow-up imaging and a lab appointment.  I discussed the assessment and treatment plan with the patient. The patient was provided with an opportunity to ask questions and all were answered. The patient agreed with the plan and demonstrated an understanding of the instructions.   The patient was advised to call back or see an in-person evaluation if the symptoms worsen or if the condition fails to  improve as anticipated.   18 minutes of total time was spent for this patient encounter, including preparation, face-to-face counseling with the patient and coordination of care, and documentation of the encounter.   Jeral Pinch, MD  Division of Gynecologic Oncology  Department of Obstetrics and Gynecology  [facility]

## 2020-02-24 DIAGNOSIS — M5117 Intervertebral disc disorders with radiculopathy, lumbosacral region: Secondary | ICD-10-CM | POA: Diagnosis not present

## 2020-02-24 DIAGNOSIS — M9903 Segmental and somatic dysfunction of lumbar region: Secondary | ICD-10-CM | POA: Diagnosis not present

## 2020-02-24 DIAGNOSIS — M5416 Radiculopathy, lumbar region: Secondary | ICD-10-CM | POA: Diagnosis not present

## 2020-02-24 DIAGNOSIS — M9906 Segmental and somatic dysfunction of lower extremity: Secondary | ICD-10-CM | POA: Diagnosis not present

## 2020-02-24 DIAGNOSIS — M9904 Segmental and somatic dysfunction of sacral region: Secondary | ICD-10-CM | POA: Diagnosis not present

## 2020-02-29 DIAGNOSIS — M5416 Radiculopathy, lumbar region: Secondary | ICD-10-CM | POA: Diagnosis not present

## 2020-02-29 DIAGNOSIS — M9903 Segmental and somatic dysfunction of lumbar region: Secondary | ICD-10-CM | POA: Diagnosis not present

## 2020-02-29 DIAGNOSIS — M5117 Intervertebral disc disorders with radiculopathy, lumbosacral region: Secondary | ICD-10-CM | POA: Diagnosis not present

## 2020-02-29 DIAGNOSIS — M9906 Segmental and somatic dysfunction of lower extremity: Secondary | ICD-10-CM | POA: Diagnosis not present

## 2020-02-29 DIAGNOSIS — M9904 Segmental and somatic dysfunction of sacral region: Secondary | ICD-10-CM | POA: Diagnosis not present

## 2020-03-03 ENCOUNTER — Telehealth: Payer: Self-pay | Admitting: *Deleted

## 2020-03-03 NOTE — Telephone Encounter (Signed)
Called the patient regarding her scheduling her US/lab/MD visit, patient stated "I told them I would call once I know my schedule. I don't know it yet. I will call once I do." Explained that I would give the doctor the message

## 2020-03-22 DIAGNOSIS — L910 Hypertrophic scar: Secondary | ICD-10-CM | POA: Diagnosis not present

## 2020-04-25 DIAGNOSIS — L910 Hypertrophic scar: Secondary | ICD-10-CM | POA: Diagnosis not present

## 2020-04-25 DIAGNOSIS — N979 Female infertility, unspecified: Secondary | ICD-10-CM | POA: Diagnosis not present

## 2020-05-04 DIAGNOSIS — R102 Pelvic and perineal pain: Secondary | ICD-10-CM | POA: Diagnosis not present

## 2020-05-04 DIAGNOSIS — Z3169 Encounter for other general counseling and advice on procreation: Secondary | ICD-10-CM | POA: Diagnosis not present

## 2020-05-04 DIAGNOSIS — N83209 Unspecified ovarian cyst, unspecified side: Secondary | ICD-10-CM | POA: Diagnosis not present

## 2020-05-05 DIAGNOSIS — R102 Pelvic and perineal pain: Secondary | ICD-10-CM | POA: Diagnosis not present

## 2020-05-11 DIAGNOSIS — Z3169 Encounter for other general counseling and advice on procreation: Secondary | ICD-10-CM | POA: Diagnosis not present

## 2020-05-13 ENCOUNTER — Other Ambulatory Visit: Payer: Self-pay | Admitting: Obstetrics and Gynecology

## 2020-05-13 DIAGNOSIS — N979 Female infertility, unspecified: Secondary | ICD-10-CM

## 2020-05-16 DIAGNOSIS — Z3169 Encounter for other general counseling and advice on procreation: Secondary | ICD-10-CM | POA: Diagnosis not present

## 2020-05-26 DIAGNOSIS — L910 Hypertrophic scar: Secondary | ICD-10-CM | POA: Diagnosis not present

## 2020-06-16 DIAGNOSIS — F439 Reaction to severe stress, unspecified: Secondary | ICD-10-CM | POA: Diagnosis not present

## 2020-06-22 DIAGNOSIS — F439 Reaction to severe stress, unspecified: Secondary | ICD-10-CM | POA: Diagnosis not present

## 2020-06-23 DIAGNOSIS — L910 Hypertrophic scar: Secondary | ICD-10-CM | POA: Diagnosis not present

## 2020-06-29 DIAGNOSIS — N83202 Unspecified ovarian cyst, left side: Secondary | ICD-10-CM | POA: Diagnosis not present

## 2020-06-29 DIAGNOSIS — F439 Reaction to severe stress, unspecified: Secondary | ICD-10-CM | POA: Diagnosis not present

## 2020-07-08 DIAGNOSIS — N979 Female infertility, unspecified: Secondary | ICD-10-CM | POA: Diagnosis not present

## 2020-11-15 DIAGNOSIS — L298 Other pruritus: Secondary | ICD-10-CM | POA: Diagnosis not present

## 2020-11-15 DIAGNOSIS — L910 Hypertrophic scar: Secondary | ICD-10-CM | POA: Diagnosis not present

## 2020-11-17 DIAGNOSIS — Z319 Encounter for procreative management, unspecified: Secondary | ICD-10-CM | POA: Diagnosis not present

## 2020-11-17 DIAGNOSIS — N979 Female infertility, unspecified: Secondary | ICD-10-CM | POA: Diagnosis not present

## 2020-11-23 DIAGNOSIS — Z23 Encounter for immunization: Secondary | ICD-10-CM | POA: Diagnosis not present

## 2020-11-28 DIAGNOSIS — Z3141 Encounter for fertility testing: Secondary | ICD-10-CM | POA: Diagnosis not present

## 2020-11-28 DIAGNOSIS — Z319 Encounter for procreative management, unspecified: Secondary | ICD-10-CM | POA: Diagnosis not present

## 2020-11-28 DIAGNOSIS — N979 Female infertility, unspecified: Secondary | ICD-10-CM | POA: Diagnosis not present

## 2020-11-29 DIAGNOSIS — Z3202 Encounter for pregnancy test, result negative: Secondary | ICD-10-CM | POA: Diagnosis not present

## 2020-11-29 DIAGNOSIS — Z3141 Encounter for fertility testing: Secondary | ICD-10-CM | POA: Diagnosis not present

## 2020-12-21 DIAGNOSIS — F439 Reaction to severe stress, unspecified: Secondary | ICD-10-CM | POA: Diagnosis not present

## 2021-03-21 DIAGNOSIS — Z3189 Encounter for other procreative management: Secondary | ICD-10-CM | POA: Diagnosis not present

## 2021-03-23 DIAGNOSIS — Z3189 Encounter for other procreative management: Secondary | ICD-10-CM | POA: Diagnosis not present

## 2021-03-30 DIAGNOSIS — L910 Hypertrophic scar: Secondary | ICD-10-CM | POA: Diagnosis not present

## 2021-03-30 DIAGNOSIS — L298 Other pruritus: Secondary | ICD-10-CM | POA: Diagnosis not present

## 2021-04-11 DIAGNOSIS — Z7189 Other specified counseling: Secondary | ICD-10-CM | POA: Diagnosis not present

## 2021-04-17 DIAGNOSIS — Z3189 Encounter for other procreative management: Secondary | ICD-10-CM | POA: Diagnosis not present

## 2021-04-18 DIAGNOSIS — Z3189 Encounter for other procreative management: Secondary | ICD-10-CM | POA: Diagnosis not present

## 2021-04-20 DIAGNOSIS — L298 Other pruritus: Secondary | ICD-10-CM | POA: Diagnosis not present

## 2021-04-20 DIAGNOSIS — L910 Hypertrophic scar: Secondary | ICD-10-CM | POA: Diagnosis not present

## 2021-05-11 DIAGNOSIS — Z3189 Encounter for other procreative management: Secondary | ICD-10-CM | POA: Diagnosis not present

## 2021-05-13 DIAGNOSIS — Z3189 Encounter for other procreative management: Secondary | ICD-10-CM | POA: Diagnosis not present

## 2021-06-08 DIAGNOSIS — L816 Other disorders of diminished melanin formation: Secondary | ICD-10-CM | POA: Diagnosis not present

## 2021-06-08 DIAGNOSIS — L298 Other pruritus: Secondary | ICD-10-CM | POA: Diagnosis not present

## 2021-06-08 DIAGNOSIS — L910 Hypertrophic scar: Secondary | ICD-10-CM | POA: Diagnosis not present

## 2021-06-14 DIAGNOSIS — F439 Reaction to severe stress, unspecified: Secondary | ICD-10-CM | POA: Diagnosis not present

## 2021-06-16 DIAGNOSIS — F439 Reaction to severe stress, unspecified: Secondary | ICD-10-CM | POA: Diagnosis not present

## 2021-10-24 DIAGNOSIS — L910 Hypertrophic scar: Secondary | ICD-10-CM | POA: Diagnosis not present

## 2021-10-24 DIAGNOSIS — L298 Other pruritus: Secondary | ICD-10-CM | POA: Diagnosis not present

## 2021-10-24 DIAGNOSIS — L816 Other disorders of diminished melanin formation: Secondary | ICD-10-CM | POA: Diagnosis not present

## 2021-11-22 IMAGING — CT CT ABD-PELV W/ CM
1 of 2 series · 13 of 32 positions shown, 18 images · IV contrast (APPLIED)
Comparison: X-ray dated February 25, 2015

CLINICAL DATA: Right-sided abdominal pain times 18 months

EXAM:
CT ABDOMEN AND PELVIS WITH CONTRAST
TECHNIQUE: Multidetector CT imaging of the abdomen and pelvis was performed
using the standard protocol following bolus administration of
intravenous contrast.
CONTRAST:  100mL TVAV4Y-8DD IOPAMIDOL (TVAV4Y-8DD) INJECTION 61%

[Series 2: abd/pelvis w/cm · axial · 0.72mm/px · z∈[-424,-9]mm · 13 of 95 slices shown, 18 images]
[im 6/95  soft-tissue]
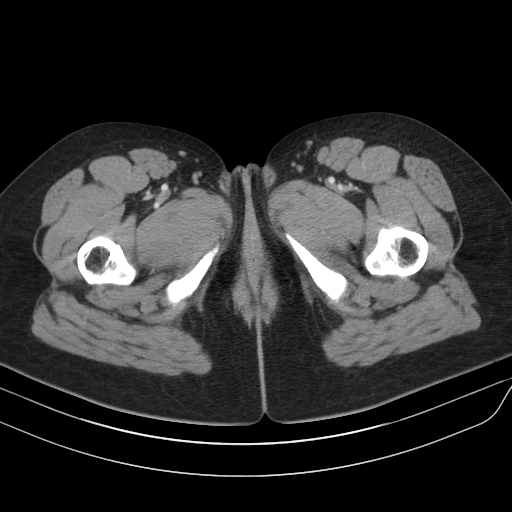
[im 6/95  bone]
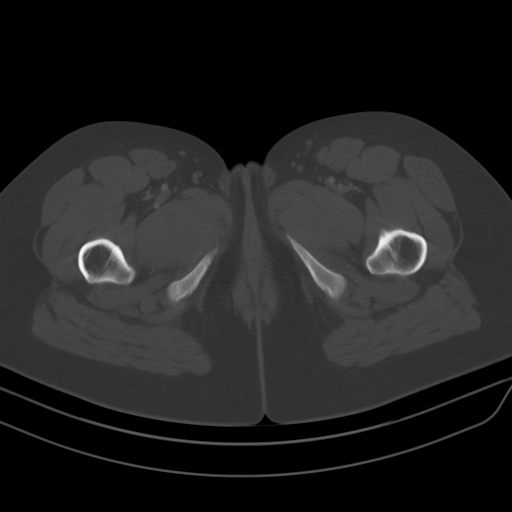
[im 17/95  soft-tissue]
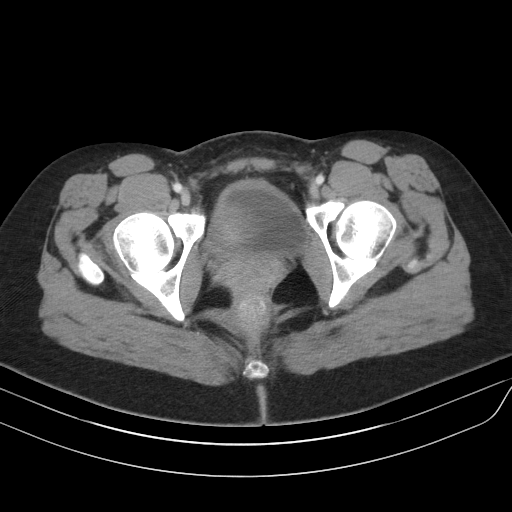
[im 23/95  soft-tissue]
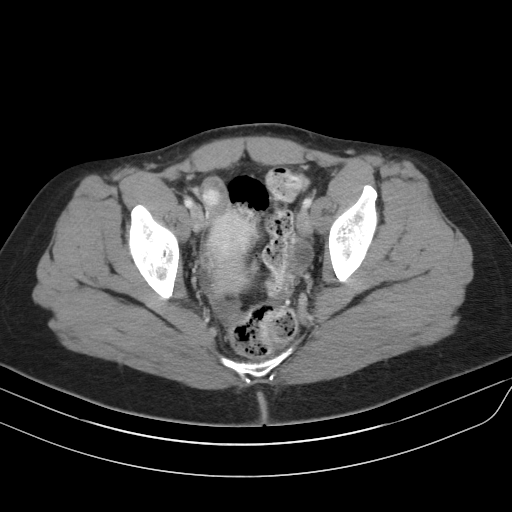
[im 28/95  soft-tissue]
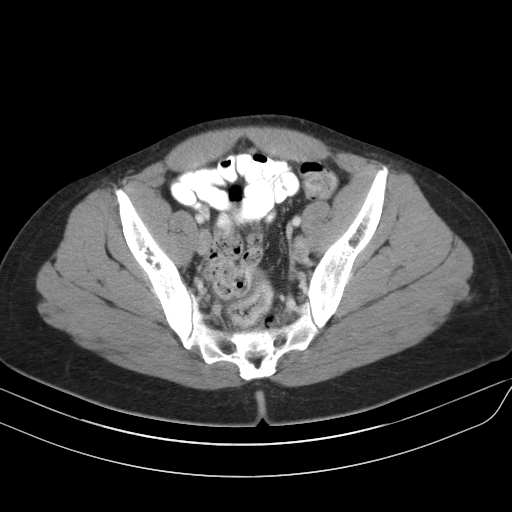
[im 39/95  soft-tissue]
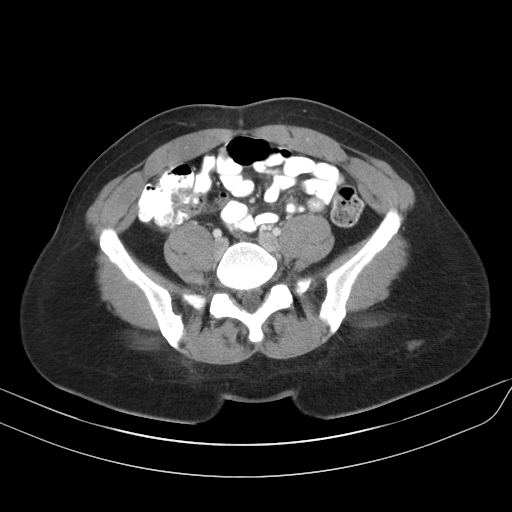
[im 45/95  soft-tissue]
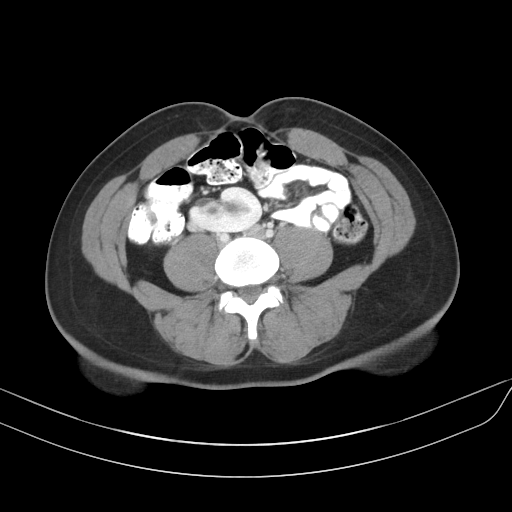
[im 50/95  soft-tissue]
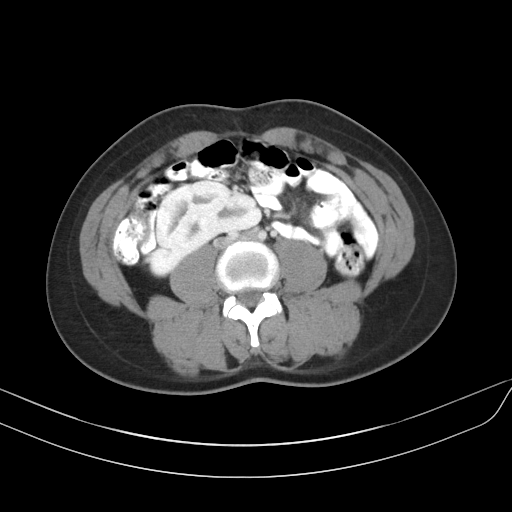
[im 61/95  soft-tissue]
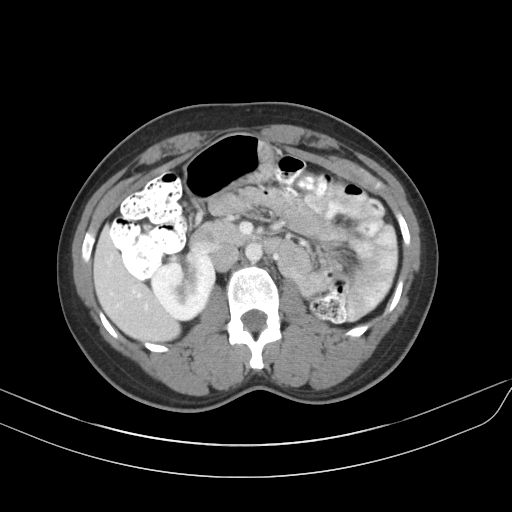
[im 67/95  soft-tissue]
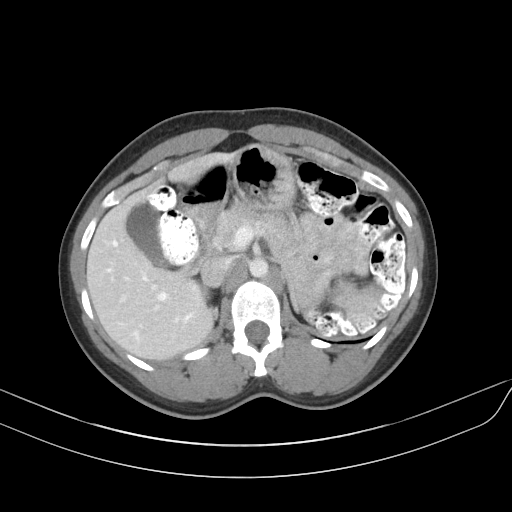
[im 67/95  bone]
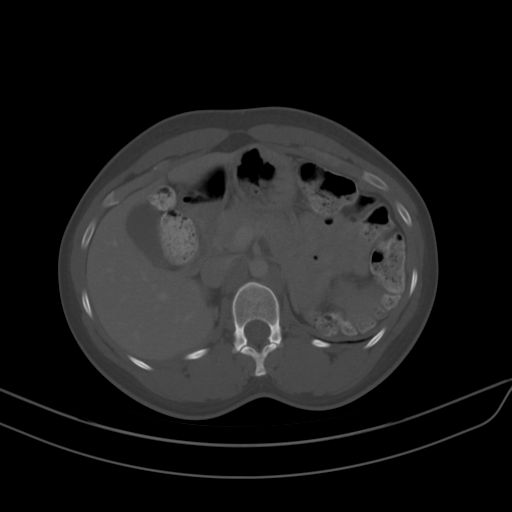
[im 72/95  soft-tissue]
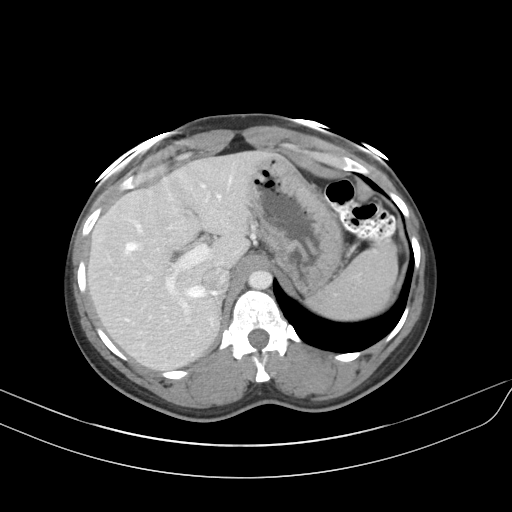
[im 72/95  lung]
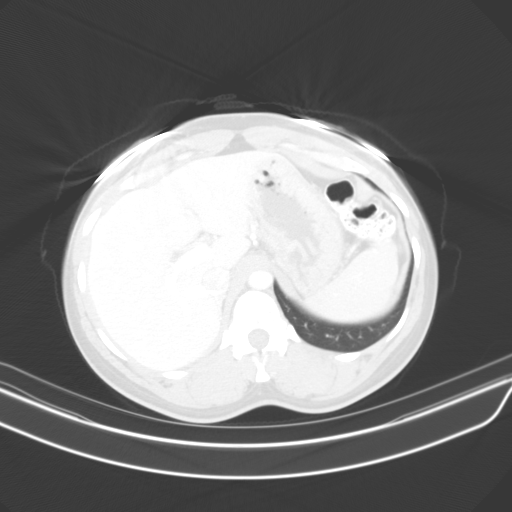
[im 78/95  lung]
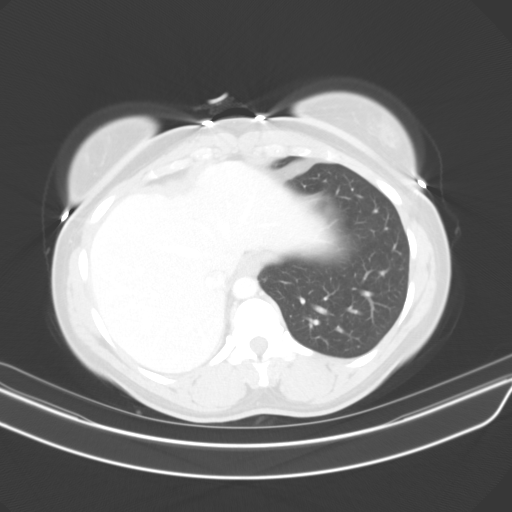
[im 83/95  soft-tissue]
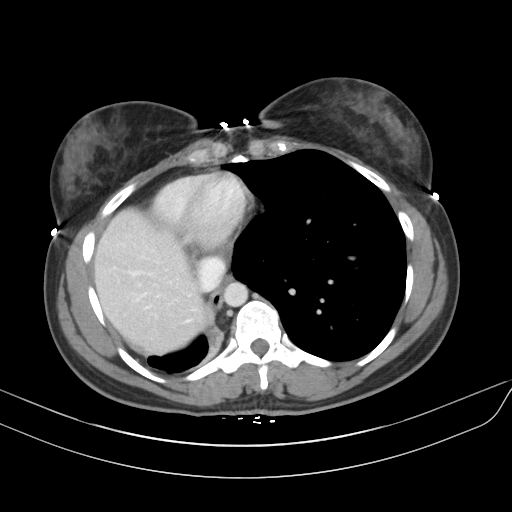
[im 83/95  lung]
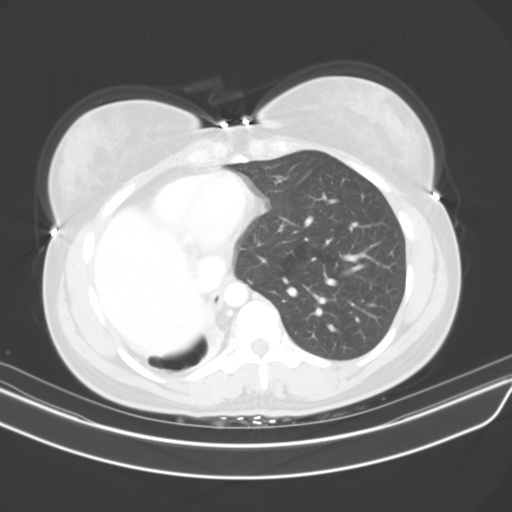
[im 89/95  soft-tissue]
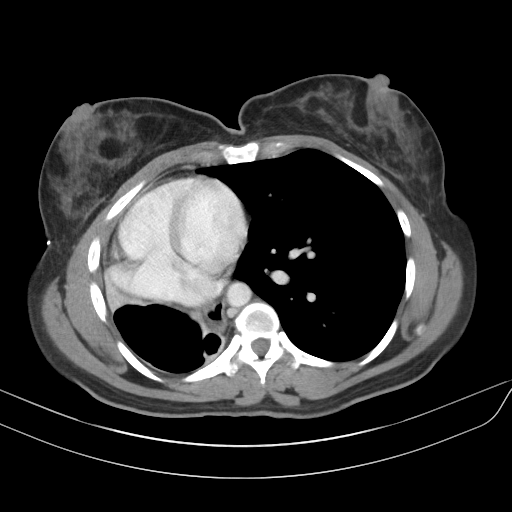
[im 89/95  lung]
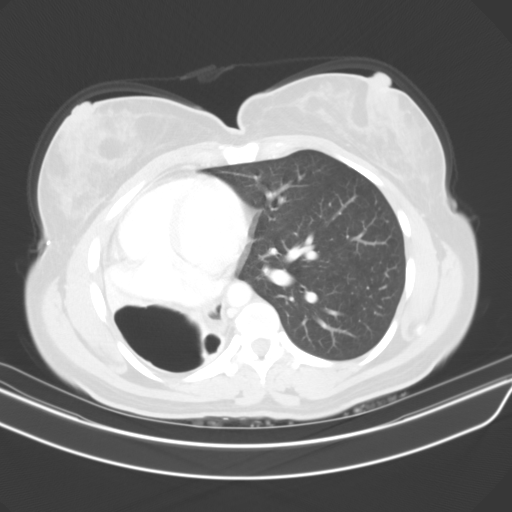

[13 of 32 positions shown; findings below may reference images not displayed]

FINDINGS: Lower chest: Again noted is significant right-sided volume loss with
areas of bronchiectasis and architectural distortion. This is
similar to prior chest x-ray in 3495 given differences in technique.
This is favored to be secondary to an old infectious or inflammatory
insult or congenital abnormality.The mediastinum is shifted
significantly to the patient's right secondary to right lung volume
loss.

Hepatobiliary: The liver is normal. Normal gallbladder.There is no
biliary ductal dilation.

Pancreas: Normal contours without ductal dilatation. No
peripancreatic fluid collection.

Spleen: Unremarkable.

Adrenals/Urinary Tract:

--Adrenal glands: Unremarkable.

--kidneys: There is cross fused ectopia. There is no hydronephrosis.
No radiopaque kidney stone.

--Urinary bladder: There appears to be mild bladder wall thickening.

Stomach/Bowel:

--Stomach/Duodenum: No hiatal hernia or other gastric abnormality.
Normal duodenal course and caliber.

--Small bowel: Unremarkable.

--Colon: Unremarkable.

--Appendix: Normal.

Vascular/Lymphatic: Normal course and caliber of the major abdominal
vessels.

--No retroperitoneal lymphadenopathy.

--No mesenteric lymphadenopathy.

--No pelvic or inguinal lymphadenopathy.

Reproductive: There is a enhancing nodule in the right ovary
measuring approximately 1.4 cm (axial series 2, image 74). There is
a dominant 1.9 cm follicle involving the left ovary. There is a
questionable septate uterus.

Other: There is a small volume of pelvic free fluid which is likely
physiologic. No free air. The abdominal wall is normal.

Musculoskeletal. No acute displaced fractures.
IMPRESSION: 1. Possible mild bladder wall thickening. Correlate with urinalysis.
2. There is a 1.4 cm enhancing nodule in the right ovary. Given the
patient's history of right-sided abdominal pain, follow-up with a
pelvic ultrasound is recommended.
3. Incidentally noted cross fused ectopia of the kidneys. No
hydronephrosis or radiopaque kidney stones.
4. Small volume of pelvic free fluid is likely physiologic.
5. Again noted is significant right lung volume loss as was
previously described in 3495.
6. Questionable septate uterus. Attention on follow-up pelvic
ultrasound is recommended.

## 2021-12-01 DIAGNOSIS — Z789 Other specified health status: Secondary | ICD-10-CM | POA: Diagnosis not present

## 2021-12-01 DIAGNOSIS — Z1329 Encounter for screening for other suspected endocrine disorder: Secondary | ICD-10-CM | POA: Diagnosis not present

## 2021-12-01 DIAGNOSIS — Z23 Encounter for immunization: Secondary | ICD-10-CM | POA: Diagnosis not present

## 2021-12-01 DIAGNOSIS — Z0001 Encounter for general adult medical examination with abnormal findings: Secondary | ICD-10-CM | POA: Diagnosis not present

## 2021-12-01 DIAGNOSIS — L910 Hypertrophic scar: Secondary | ICD-10-CM | POA: Diagnosis not present

## 2021-12-01 DIAGNOSIS — Z124 Encounter for screening for malignant neoplasm of cervix: Secondary | ICD-10-CM | POA: Diagnosis not present

## 2022-01-02 DIAGNOSIS — Z789 Other specified health status: Secondary | ICD-10-CM | POA: Diagnosis not present

## 2022-01-02 DIAGNOSIS — L910 Hypertrophic scar: Secondary | ICD-10-CM | POA: Diagnosis not present

## 2022-01-05 DIAGNOSIS — F439 Reaction to severe stress, unspecified: Secondary | ICD-10-CM | POA: Diagnosis not present

## 2022-01-18 DIAGNOSIS — F439 Reaction to severe stress, unspecified: Secondary | ICD-10-CM | POA: Diagnosis not present

## 2022-01-22 DIAGNOSIS — Z789 Other specified health status: Secondary | ICD-10-CM | POA: Diagnosis not present

## 2022-01-22 DIAGNOSIS — F439 Reaction to severe stress, unspecified: Secondary | ICD-10-CM | POA: Diagnosis not present

## 2022-01-22 DIAGNOSIS — L538 Other specified erythematous conditions: Secondary | ICD-10-CM | POA: Diagnosis not present

## 2022-01-22 DIAGNOSIS — L910 Hypertrophic scar: Secondary | ICD-10-CM | POA: Diagnosis not present

## 2022-01-22 DIAGNOSIS — L298 Other pruritus: Secondary | ICD-10-CM | POA: Diagnosis not present

## 2022-03-07 DIAGNOSIS — Z789 Other specified health status: Secondary | ICD-10-CM | POA: Diagnosis not present

## 2022-03-07 DIAGNOSIS — L538 Other specified erythematous conditions: Secondary | ICD-10-CM | POA: Diagnosis not present

## 2022-03-07 DIAGNOSIS — L298 Other pruritus: Secondary | ICD-10-CM | POA: Diagnosis not present

## 2022-03-07 DIAGNOSIS — L910 Hypertrophic scar: Secondary | ICD-10-CM | POA: Diagnosis not present

## 2022-03-30 DIAGNOSIS — Z319 Encounter for procreative management, unspecified: Secondary | ICD-10-CM | POA: Diagnosis not present

## 2022-03-30 DIAGNOSIS — Z31430 Encounter of female for testing for genetic disease carrier status for procreative management: Secondary | ICD-10-CM | POA: Diagnosis not present

## 2022-03-30 DIAGNOSIS — Z3141 Encounter for fertility testing: Secondary | ICD-10-CM | POA: Diagnosis not present

## 2022-03-30 DIAGNOSIS — N979 Female infertility, unspecified: Secondary | ICD-10-CM | POA: Diagnosis not present

## 2022-04-03 DIAGNOSIS — Z789 Other specified health status: Secondary | ICD-10-CM | POA: Diagnosis not present

## 2022-04-03 DIAGNOSIS — L538 Other specified erythematous conditions: Secondary | ICD-10-CM | POA: Diagnosis not present

## 2022-04-03 DIAGNOSIS — L298 Other pruritus: Secondary | ICD-10-CM | POA: Diagnosis not present

## 2022-04-03 DIAGNOSIS — L910 Hypertrophic scar: Secondary | ICD-10-CM | POA: Diagnosis not present

## 2022-04-26 DIAGNOSIS — Z319 Encounter for procreative management, unspecified: Secondary | ICD-10-CM | POA: Diagnosis not present

## 2022-05-01 DIAGNOSIS — L910 Hypertrophic scar: Secondary | ICD-10-CM | POA: Diagnosis not present

## 2022-05-01 DIAGNOSIS — Z789 Other specified health status: Secondary | ICD-10-CM | POA: Diagnosis not present

## 2022-05-03 DIAGNOSIS — N979 Female infertility, unspecified: Secondary | ICD-10-CM | POA: Diagnosis not present

## 2022-05-03 DIAGNOSIS — Z319 Encounter for procreative management, unspecified: Secondary | ICD-10-CM | POA: Diagnosis not present

## 2022-05-22 DIAGNOSIS — Z3141 Encounter for fertility testing: Secondary | ICD-10-CM | POA: Diagnosis not present

## 2022-05-22 DIAGNOSIS — N711 Chronic inflammatory disease of uterus: Secondary | ICD-10-CM | POA: Diagnosis not present

## 2022-05-29 DIAGNOSIS — Z789 Other specified health status: Secondary | ICD-10-CM | POA: Diagnosis not present

## 2022-05-29 DIAGNOSIS — L910 Hypertrophic scar: Secondary | ICD-10-CM | POA: Diagnosis not present

## 2022-06-14 DIAGNOSIS — N979 Female infertility, unspecified: Secondary | ICD-10-CM | POA: Diagnosis not present

## 2022-06-14 DIAGNOSIS — Z3141 Encounter for fertility testing: Secondary | ICD-10-CM | POA: Diagnosis not present

## 2022-06-14 DIAGNOSIS — D259 Leiomyoma of uterus, unspecified: Secondary | ICD-10-CM | POA: Diagnosis not present

## 2022-06-26 DIAGNOSIS — L910 Hypertrophic scar: Secondary | ICD-10-CM | POA: Diagnosis not present

## 2022-07-05 DIAGNOSIS — R918 Other nonspecific abnormal finding of lung field: Secondary | ICD-10-CM | POA: Diagnosis not present

## 2022-07-05 DIAGNOSIS — Z8611 Personal history of tuberculosis: Secondary | ICD-10-CM | POA: Diagnosis not present

## 2022-07-05 DIAGNOSIS — R059 Cough, unspecified: Secondary | ICD-10-CM | POA: Diagnosis not present

## 2022-07-09 DIAGNOSIS — R918 Other nonspecific abnormal finding of lung field: Secondary | ICD-10-CM | POA: Diagnosis not present

## 2022-07-09 DIAGNOSIS — R928 Other abnormal and inconclusive findings on diagnostic imaging of breast: Secondary | ICD-10-CM | POA: Diagnosis not present

## 2022-07-09 DIAGNOSIS — J984 Other disorders of lung: Secondary | ICD-10-CM | POA: Diagnosis not present

## 2022-07-09 DIAGNOSIS — J479 Bronchiectasis, uncomplicated: Secondary | ICD-10-CM | POA: Diagnosis not present

## 2022-07-17 DIAGNOSIS — F439 Reaction to severe stress, unspecified: Secondary | ICD-10-CM | POA: Diagnosis not present

## 2022-08-20 DIAGNOSIS — L910 Hypertrophic scar: Secondary | ICD-10-CM | POA: Diagnosis not present

## 2022-08-20 DIAGNOSIS — Z789 Other specified health status: Secondary | ICD-10-CM | POA: Diagnosis not present

## 2022-11-19 DIAGNOSIS — Z789 Other specified health status: Secondary | ICD-10-CM | POA: Diagnosis not present

## 2022-11-19 DIAGNOSIS — L910 Hypertrophic scar: Secondary | ICD-10-CM | POA: Diagnosis not present

## 2022-12-13 DIAGNOSIS — F439 Reaction to severe stress, unspecified: Secondary | ICD-10-CM | POA: Diagnosis not present

## 2022-12-19 DIAGNOSIS — Z789 Other specified health status: Secondary | ICD-10-CM | POA: Diagnosis not present

## 2022-12-19 DIAGNOSIS — L910 Hypertrophic scar: Secondary | ICD-10-CM | POA: Diagnosis not present

## 2023-01-02 DIAGNOSIS — Z789 Other specified health status: Secondary | ICD-10-CM | POA: Diagnosis not present

## 2023-01-02 DIAGNOSIS — L910 Hypertrophic scar: Secondary | ICD-10-CM | POA: Diagnosis not present

## 2023-01-17 DIAGNOSIS — L910 Hypertrophic scar: Secondary | ICD-10-CM | POA: Diagnosis not present

## 2023-01-17 DIAGNOSIS — Z789 Other specified health status: Secondary | ICD-10-CM | POA: Diagnosis not present

## 2023-07-15 NOTE — Progress Notes (Signed)
 Radiation Oncology         [phone] ________________________________  Initial outpatient Consultation  Name: Monica Bradley MRN: [id number]  Date: 07/16/2023  DOB: 04-16-1985  NW:[id number], No Pcp Per  Royal Cordon, MD   REFERRING PHYSICIAN: Royal Cordon, MD  DIAGNOSIS: No diagnosis found.  Skin Keloid of Right Medial Superior Chest  CHIEF COMPLAINT: Here to discuss management of skin keloid  HISTORY OF PRESENT ILLNESS::Monica Bradley is a 38 y.o. female with a history of a keloid located on her right medial superior chest region.   Patient complains of redness and irritation of the impacted region. Patient is currently followed by dermatologist Henreitta Locus. During her most recent visit on 07/11/23 keloid was treated with an injection of 0.4 cc intralesional kenalog.   Dr. Henreitta Locus recommended undergoing XRT as injections are not as effective.      PREVIOUS RADIATION THERAPY: No  PAST MEDICAL HISTORY:  has a past medical history of Abnormal uterine bleeding (AUB), CIN III (cervical intraepithelial neoplasia III) (09/2015), History of TB (tuberculosis), and Ovarian mass, right.    PAST SURGICAL HISTORY: Past Surgical History:  Procedure Laterality Date   LEEP  09/23/2015    FAMILY HISTORY: family history is not on file.  SOCIAL HISTORY:  reports that she has never smoked. She has never used smokeless tobacco. She reports that she does not drink alcohol and does not use drugs.  ALLERGIES: Patient has no known allergies.  MEDICATIONS:  Current Outpatient Medications  Medication Sig Dispense Refill   folic acid (FOLVITE) 1 MG tablet Take 1 mg by mouth daily. (Patient not taking: Reported on 12/21/2019)     Prenatal Vit-Fe Fumarate-FA (MULTIVITAMIN-PRENATAL) 27-0.8 MG TABS tablet Take 1 tablet by mouth daily at 12 noon.     No current facility-administered medications for this encounter.    REVIEW OF SYSTEMS:  Notable for that above.   PHYSICAL EXAM:  vitals were not  taken for this visit.   General: Alert and oriented, in no acute distress *** HEENT: Head is normocephalic. Extraocular movements are intact. Oropharynx is clear. Neck: Neck is supple, no palpable cervical or supraclavicular lymphadenopathy. Heart: Regular in rate and rhythm with no murmurs, rubs, or gallops. Chest: Clear to auscultation bilaterally, with no rhonchi, wheezes, or rales. Abdomen: Soft, nontender, nondistended, with no rigidity or guarding. Extremities: No cyanosis or edema. Lymphatics: see Neck Exam Skin: No concerning lesions. Musculoskeletal: symmetric strength and muscle tone throughout. Neurologic: Cranial nerves II through XII are grossly intact. No obvious focalities. Speech is fluent. Coordination is intact. Psychiatric: Judgment and insight are intact. Affect is appropriate.   ECOG = ***  0 - Asymptomatic (Fully active, able to carry on all predisease activities without restriction)  1 - Symptomatic but completely ambulatory (Restricted in physically strenuous activity but ambulatory and able to carry out work of a light or sedentary nature. For example, light housework, office work)  2 - Symptomatic, <50% in bed during the day (Ambulatory and capable of all self care but unable to carry out any work activities. Up and about more than 50% of waking hours)  3 - Symptomatic, >50% in bed, but not bedbound (Capable of only limited self-care, confined to bed or chair 50% or more of waking hours)  4 - Bedbound (Completely disabled. Cannot carry on any self-care. Totally confined to bed or chair)  5 - Death   Aurea Blossom MM, Creech RH, Tormey DC, et al. [phone]). "Toxicity and response criteria of the [facility]  [facility]". Am. Hillard Lowes. Oncol. 5 (6): 649-55   LABORATORY DATA:  No results found for: "WBC", "HGB", "HCT", "MCV", "PLT" CMP  No results found for: "NA", "K", "CL", "CO2", "GLUCOSE", "BUN", "CREATININE", "CALCIUM", "PROT", "ALBUMIN", "AST", "ALT",  "ALKPHOS", "BILITOT", "GFR", "EGFR", "GFRNONAA"       RADIOGRAPHY: No results found.    IMPRESSION/PLAN:***    On date of service, in total, I spent *** minutes on this encounter. Patient was seen in person.   __________________________________________   Colie Dawes, MD  This document serves as a record of services personally performed by Colie Dawes, MD. It was created on her behalf by Lucky Sable, a trained medical scribe. The creation of this record is based on the scribe's personal observations and the provider's statements to them. This document has been checked and approved by the attending provider.

## 2023-07-15 NOTE — Progress Notes (Incomplete)
 Histology and Location  Keloid on The Right Medial Superior Chest  Monica Bradley presented with the following signs/symptoms,   Redness and irritation/itching on Keloid site. Past/Anticipated interventions by patient's surgeon/dermatologist for current problematic lesion, if any:  Henreitta Locus, MD 1 Lesion was injected with Intralesional Kenalog 07/11/2023 Henreitta Locus, MD Plan    SAFETY ISSUES: Prior radiation? Yes Pacemaker/ICD? None Possible current pregnancy? None Is the patient on methotrexate? None  Current Complaints / other details:  None    Wt Readings from Last 3 Encounters:  07/16/23 164 lb 12.8 oz (74.8 kg)  12/21/19 164 lb (74.4 kg)   BP 116/82 (BP Location: Left Arm, Patient Position: Sitting, Cuff Size: Normal)   Pulse 90   Temp 97.9 F (36.6 C)   Resp 18   Ht 5\' 4"  (1.626 m)   Wt 164 lb 12.8 oz (74.8 kg)   SpO2 98%   BMI 28.29 kg/m

## 2023-07-16 ENCOUNTER — Encounter: Payer: Self-pay | Admitting: Radiation Oncology

## 2023-07-16 ENCOUNTER — Ambulatory Visit
Admission: RE | Admit: 2023-07-16 | Discharge: 2023-07-16 | Disposition: A | Discharge status: Home / Self Care | Source: Ambulatory Visit | Attending: Radiation Oncology | Admitting: Radiation Oncology

## 2023-07-16 VITALS — BP 116/82 | HR 90 | Temp 97.9°F | Resp 18 | Ht 64.0 in | Wt 164.8 lb

## 2023-07-16 DIAGNOSIS — L910 Hypertrophic scar: Secondary | ICD-10-CM | POA: Insufficient documentation

## 2023-07-16 HISTORY — DX: Hypertrophic scar: L91.0

## 2023-10-02 ENCOUNTER — Telehealth: Payer: Self-pay | Admitting: Radiology

## 2023-10-02 NOTE — Telephone Encounter (Signed)
 Called patient to follow-up on decision regarding radiation treatment. The patient did not answer. LVM asking the patient to call back with her decision.     Leeroy Due, PA-C

## 2023-10-16 ENCOUNTER — Telehealth: Payer: Self-pay | Admitting: Radiation Oncology

## 2023-10-16 NOTE — Telephone Encounter (Signed)
 9/10 Received call from patient.  She stated ready to proceed with her treatments, has been traveling and will be back in [place] on 9/15, email sent to Ellen/CT sim,/Support RTT, so they are aware.

## 2023-10-17 ENCOUNTER — Telehealth: Payer: Self-pay | Admitting: Radiation Oncology

## 2023-10-17 NOTE — Telephone Encounter (Signed)
 9/11 @ 9:25 am Left voicemail for patient to call our office to be sch for follow up appt.

## 2023-10-18 NOTE — Progress Notes (Deleted)
 SABRA

## 2023-10-22 ENCOUNTER — Ambulatory Visit: Admitting: Radiation Oncology

## 2023-10-22 ENCOUNTER — Ambulatory Visit

## 2023-10-23 ENCOUNTER — Ambulatory Visit
Admission: RE | Admit: 2023-10-23 | Discharge: 2023-10-23 | Disposition: A | Discharge status: Home / Self Care | Source: Ambulatory Visit | Attending: Radiation Oncology | Admitting: Radiation Oncology

## 2023-10-23 ENCOUNTER — Other Ambulatory Visit: Payer: Self-pay

## 2023-10-23 ENCOUNTER — Encounter: Payer: Self-pay | Admitting: Radiation Oncology

## 2023-10-23 ENCOUNTER — Telehealth: Payer: Self-pay | Admitting: Radiation Oncology

## 2023-10-23 DIAGNOSIS — L910 Hypertrophic scar: Secondary | ICD-10-CM

## 2023-10-23 NOTE — Progress Notes (Signed)
 Radiation Oncology         [phone] ________________________________  Name: Sofiah Lyne MRN: [id number]  Date: 10/23/2023  DOB: 08/21/1985  Follow-Up Visit Note  Outpatient by telephone.  The patient opted for telemedicine to maximize safety during the pandemic.  MyChart video was not obtainable.   CC: Patient, No Pcp Per  Dietrich Alyce BROCKS, MD  Diagnosis and Prior Radiotherapy:    ICD-10-CM   1. Keloid of skin  L91.0       CHIEF COMPLAINT: Breast keloid   Narrative:  The patient returns today for routine follow-up.  She anticipates coming in for CT simulation later this week and denies any new complaints since her consultation.  She is still bothered by the keloid on her right breast and does not want to go through surgery for this.  She is still interested in definitive radiation therapy.  She got back from her out-of-town trip.  Her main source of stress is that she was scammed and lost quite a bit through this scam.  A referral has been made to social work.                              ALLERGIES:  has no known allergies.  Meds: Current Outpatient Medications  Medication Sig Dispense Refill   folic acid (FOLVITE) 1 MG tablet Take 1 mg by mouth daily.     Prenatal Vit-Fe Fumarate-FA (MULTIVITAMIN-PRENATAL) 27-0.8 MG TABS tablet Take 1 tablet by mouth daily at 12 noon.     No current facility-administered medications for this encounter.    Physical Findings: The patient is in no acute distress. Patient is alert and oriented.  vitals were not taken for this visit. .      Lab Findings: No results found for: WBC, HGB, HCT, MCV, PLT  Radiographic Findings: No results found.  Impression/Plan:  Skin Keloid of Right Medial Superior Chest  We again reviewed the patient's current workup.  She presents today with a keloid of the chest that has not responded to steroid injections. She is not interested in surgical resection at this time even though she understands  that this is standard of care for up front treatment.  We discussed that traditionally, radiation is used adjuvantly in the setting of keloids. However, for patients who are not responding to alternative treatments and would like to avoid surgery, radiation can be used for definitive treatment.  We discussed the natural history of keloids and general treatment, highlighting the role of radiotherapy in the management.  We discussed the available radiation techniques, and focused on the details of logistics and delivery.  We reviewed the anticipated acute and late sequelae associated with radiation in this setting.  The patient was encouraged to ask questions that I answered to the best of my ability.     I recommend five fractions of radiation given (37.5Gy / 5 fractions) administered twice weekly and completed in 2.5 weeks.  This is a bit of an adaptation from what she was told at her consultation (weekly scheduling) but it is based on some promising data from [place] which I discussed with her in detail today.  Notably, the patient was informed about the risks associated with pregnancy during radiation therapy and understands the importance of notifying the team if there are any concerns about a potential pregnancy.  I have asked nursing to arrange a urine pregnancy test for this Friday when she comes in for  treatment planning  This encounter was provided by telemedicine platform; patient desired telemedicine during pandemic precautions.  MyChart video was not available and therefore telephone was used. The patient has given verbal consent for this type of encounter and has been advised to only accept a meeting of this type in a secure network environment. On date of service, in total, I spent 30 minutes on this encounter.   The attendants for this meeting include Lauraine Golden  and Reginia Lagos During the encounter, Lauraine Golden was located at [facility] Radiation Oncology Department.   Reginia Lagos was located at home.    _____________________________________   Lauraine Golden, MD

## 2023-10-23 NOTE — Telephone Encounter (Signed)
 9/17 Forward outgoing referral to Michelle Zavala - [facility]  Social Worker.

## 2023-10-24 ENCOUNTER — Inpatient Hospital Stay: Attending: Licensed Clinical Social Worker | Admitting: Licensed Clinical Social Worker

## 2023-10-24 NOTE — Progress Notes (Signed)
 [facility] Clinical Social Work  Clinical Social Work was referred by radiation oncology for need for community resources.  Clinical Social Worker contacted patient by phone to offer support and assess for needs.  Pt recently lost her savings through what was presented as an investment and is struggling financially as a result.   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 She is getting by. Prefers not to eat canned goods  Pt has a friend who is helping her with some money for rent. Pt is ineligible for government programs like SNAP.     Interventions: Provided patient with information about local food pantries, including those with fresh items  Provided information on [employer] for additional resources/ connection  Offered [facility] food pantry- pt declined for now due to dietary preferences     Follow Up Plan:  Patient will contact CSW with any support or resource needs    Sharah Finnell E Demarious Kapur, LCSW  Clinical Social Worker  [facility]

## 2023-10-25 ENCOUNTER — Ambulatory Visit
Admission: RE | Admit: 2023-10-25 | Discharge: 2023-10-25 | Disposition: A | Discharge status: Home / Self Care | Source: Ambulatory Visit | Attending: Radiation Oncology | Admitting: Radiation Oncology

## 2023-10-25 DIAGNOSIS — L910 Hypertrophic scar: Secondary | ICD-10-CM | POA: Insufficient documentation

## 2023-10-25 DIAGNOSIS — Z510 Encounter for antineoplastic radiation therapy: Secondary | ICD-10-CM | POA: Insufficient documentation

## 2023-10-25 LAB — PREGNANCY, URINE: Preg Test, Ur: NEGATIVE

## 2023-10-29 DIAGNOSIS — L910 Hypertrophic scar: Secondary | ICD-10-CM | POA: Diagnosis not present

## 2023-11-01 ENCOUNTER — Other Ambulatory Visit: Payer: Self-pay

## 2023-11-01 ENCOUNTER — Ambulatory Visit
Admission: RE | Admit: 2023-11-01 | Discharge: 2023-11-01 | Disposition: A | Discharge status: Home / Self Care | Source: Ambulatory Visit | Attending: Radiation Oncology | Admitting: Radiation Oncology

## 2023-11-01 DIAGNOSIS — L910 Hypertrophic scar: Secondary | ICD-10-CM | POA: Diagnosis not present

## 2023-11-01 LAB — RAD ONC ARIA SESSION SUMMARY
Course Elapsed Days: 0
Plan Fractions Treated to Date: 1
Plan Prescribed Dose Per Fraction: 7.5 Gy
Plan Total Fractions Prescribed: 5
Plan Total Prescribed Dose: 37.5 Gy
Reference Point Dosage Given to Date: 7.5 Gy
Reference Point Session Dosage Given: 7.5 Gy
Session Number: 1

## 2023-11-04 ENCOUNTER — Ambulatory Visit

## 2023-11-05 ENCOUNTER — Other Ambulatory Visit: Payer: Self-pay

## 2023-11-05 ENCOUNTER — Ambulatory Visit
Admission: RE | Admit: 2023-11-05 | Discharge: 2023-11-05 | Disposition: A | Source: Ambulatory Visit | Attending: Radiation Oncology

## 2023-11-05 DIAGNOSIS — L910 Hypertrophic scar: Secondary | ICD-10-CM | POA: Diagnosis not present

## 2023-11-05 LAB — RAD ONC ARIA SESSION SUMMARY
Course Elapsed Days: 4
Plan Fractions Treated to Date: 2
Plan Prescribed Dose Per Fraction: 7.5 Gy
Plan Total Fractions Prescribed: 5
Plan Total Prescribed Dose: 37.5 Gy
Reference Point Dosage Given to Date: 15 Gy
Reference Point Session Dosage Given: 7.5 Gy
Session Number: 2

## 2023-11-06 ENCOUNTER — Ambulatory Visit

## 2023-11-07 ENCOUNTER — Ambulatory Visit

## 2023-11-08 ENCOUNTER — Ambulatory Visit
Admission: RE | Admit: 2023-11-08 | Discharge: 2023-11-08 | Disposition: A | Discharge status: Home / Self Care | Source: Ambulatory Visit | Attending: Radiation Oncology | Admitting: Radiation Oncology

## 2023-11-08 ENCOUNTER — Other Ambulatory Visit: Payer: Self-pay

## 2023-11-08 DIAGNOSIS — L910 Hypertrophic scar: Secondary | ICD-10-CM | POA: Diagnosis present

## 2023-11-08 DIAGNOSIS — Z510 Encounter for antineoplastic radiation therapy: Secondary | ICD-10-CM | POA: Diagnosis present

## 2023-11-08 LAB — RAD ONC ARIA SESSION SUMMARY
Course Elapsed Days: 7
Plan Fractions Treated to Date: 3
Plan Prescribed Dose Per Fraction: 7.5 Gy
Plan Total Fractions Prescribed: 5
Plan Total Prescribed Dose: 37.5 Gy
Reference Point Dosage Given to Date: 22.5 Gy
Reference Point Session Dosage Given: 7.5 Gy
Session Number: 3

## 2023-11-12 ENCOUNTER — Ambulatory Visit

## 2023-11-12 ENCOUNTER — Other Ambulatory Visit: Payer: Self-pay

## 2023-11-12 ENCOUNTER — Ambulatory Visit
Admission: RE | Admit: 2023-11-12 | Discharge: 2023-11-12 | Disposition: A | Discharge status: Home / Self Care | Source: Ambulatory Visit | Attending: Radiation Oncology | Admitting: Radiation Oncology

## 2023-11-12 DIAGNOSIS — L910 Hypertrophic scar: Secondary | ICD-10-CM | POA: Diagnosis not present

## 2023-11-12 LAB — RAD ONC ARIA SESSION SUMMARY
Course Elapsed Days: 11
Plan Fractions Treated to Date: 4
Plan Prescribed Dose Per Fraction: 7.5 Gy
Plan Total Fractions Prescribed: 5
Plan Total Prescribed Dose: 37.5 Gy
Reference Point Dosage Given to Date: 30 Gy
Reference Point Session Dosage Given: 7.5 Gy
Session Number: 4

## 2023-11-13 ENCOUNTER — Telehealth: Payer: Self-pay

## 2023-11-13 NOTE — Telephone Encounter (Signed)
 Called Monica Bradley to remind her to come in for her PUT Friday after her final radiation treatment. Patient understood

## 2023-11-15 ENCOUNTER — Ambulatory Visit
Admission: RE | Admit: 2023-11-15 | Discharge: 2023-11-15 | Disposition: A | Discharge status: Home / Self Care | Source: Ambulatory Visit | Attending: Radiation Oncology | Admitting: Radiation Oncology

## 2023-11-15 ENCOUNTER — Other Ambulatory Visit: Payer: Self-pay

## 2023-11-15 DIAGNOSIS — L910 Hypertrophic scar: Secondary | ICD-10-CM | POA: Diagnosis not present

## 2023-11-15 LAB — RAD ONC ARIA SESSION SUMMARY
Course Elapsed Days: 14
Plan Fractions Treated to Date: 5
Plan Prescribed Dose Per Fraction: 7.5 Gy
Plan Total Fractions Prescribed: 5
Plan Total Prescribed Dose: 37.5 Gy
Reference Point Dosage Given to Date: 37.5 Gy
Reference Point Session Dosage Given: 7.5 Gy
Session Number: 5

## 2023-11-21 NOTE — Radiation Completion Notes (Signed)
 Patient Name: Monica Bradley, Monica Bradley MRN: [id number] Date of Birth: 10/22/1985 Referring Physician: ALYCE HOOF, M.D. Date of Service: 2023-11-21 Radiation Oncologist: Lauraine Golden, M.D. [facility]                             RADIATION ONCOLOGY END OF TREATMENT NOTE     Diagnosis: L91.0 Hypertrophic scar Intent: Curative     ==========DELIVERED PLANS==========  First Treatment Date: 2023-11-01 Last Treatment Date: 2023-11-15   Plan Name: Chest_R Site: Chest, Right Technique: Electron Mode: Electron Dose Per Fraction: 7.5 Gy Prescribed Dose (Delivered / Prescribed): 37.5 Gy / 37.5 Gy Prescribed Fxs (Delivered / Prescribed): 5 / 5     ==========ON TREATMENT VISIT DATES========== 2023-11-15     ==========UPCOMING VISITS========== 01/07/2024 [facility]-RADIATION ONC FOLLOW UP 30 [address] Czar M, [place]        ==========APPENDIX - ON TREATMENT VISIT NOTES==========   See weekly On Treatment Notes in Epic for details in the Media tab (listed as Progress notes on the On Treatment Visit Dates listed above).

## 2023-12-31 NOTE — Progress Notes (Incomplete)
  Radiation Oncology         [phone] ________________________________  Name: Monica Bradley MRN: [id number]  Date: 01/07/2024  DOB: December 30, 1985  Follow-Up Visit Note  CC: Patient, No Pcp Per  No ref. provider found  Diagnosis and Prior Radiotherapy:    No diagnosis found. ***   ==========DELIVERED PLANS==========  First Treatment Date: 2023-11-01 Last Treatment Date: 2023-11-15   Plan Name: Chest_R Site: Chest, Right Technique: Electron Mode: Electron Dose Per Fraction: 7.5 Gy Prescribed Dose (Delivered / Prescribed): 37.5 Gy / 37.5 Gy Prescribed Fxs (Delivered / Prescribed): 5 / 5  Breast keloid; s/p definitive radiation completed on 11/15/2023  CHIEF COMPLAINT:  Here for follow-up and surveillance of *** cancer  Narrative:  The patient returns today for routine follow-up.  ***                    ALLERGIES:  has no known allergies.  Meds: Current Outpatient Medications  Medication Sig Dispense Refill   folic acid (FOLVITE) 1 MG tablet Take 1 mg by mouth daily.     Prenatal Vit-Fe Fumarate-FA (MULTIVITAMIN-PRENATAL) 27-0.8 MG TABS tablet Take 1 tablet by mouth daily at 12 noon.     No current facility-administered medications for this visit.    Physical Findings: The patient is in no acute distress. Patient is alert and oriented. Wt Readings from Last 3 Encounters:  07/16/23 164 lb 12.8 oz (74.8 kg)  12/21/19 164 lb (74.4 kg)    vitals were not taken for this visit. .  General: Alert and oriented, in no acute distress HEENT: Head is normocephalic. Extraocular movements are intact. Oropharynx is notable for *** Neck: Neck is notable for *** Skin: Skin in treatment fields shows satisfactory healing *** Heart: Regular in rate and rhythm with no murmurs, rubs, or gallops. Chest: Clear to auscultation bilaterally, with no rhonchi, wheezes, or rales. Abdomen: Soft, nontender, nondistended, with no rigidity or guarding. Extremities: No cyanosis or  edema. Lymphatics: see Neck Exam Psychiatric: Judgment and insight are intact. Affect is appropriate.   Lab Findings: No results found for: WBC, HGB, HCT, MCV, PLT  No results found for: TSH  Radiographic Findings: No results found.  Impression/Plan:  Breast keloid; s/p definitive radiation completed on 11/15/2023  Patient has healed well from the effects of her radiation treatment.   Radiation follow-up PRN. We appreciate the opportunity to take part in this patient's care. She was encouraged to call with any questions or concerns.   On date of service, in total, I spent *** minutes on this encounter. Patient was seen in person. _____________________________________    Leeroy Due, PA-C

## 2024-01-07 ENCOUNTER — Telehealth: Payer: Self-pay | Admitting: Radiology

## 2024-01-07 ENCOUNTER — Ambulatory Visit: Admitting: Radiology

## 2024-01-07 NOTE — Telephone Encounter (Signed)
 Returned pt's call and r/s missed appt to 12/16

## 2024-01-21 ENCOUNTER — Encounter: Payer: Self-pay | Admitting: Radiology

## 2024-01-21 ENCOUNTER — Ambulatory Visit
Admission: RE | Admit: 2024-01-21 | Discharge: 2024-01-21 | Disposition: A | Discharge status: Home / Self Care | Source: Ambulatory Visit | Attending: Radiology | Admitting: Radiology

## 2024-01-21 VITALS — BP 109/71 | HR 77 | Temp 98.3°F | Resp 18 | Ht 64.0 in | Wt 172.4 lb

## 2024-01-21 DIAGNOSIS — Z923 Personal history of irradiation: Secondary | ICD-10-CM | POA: Diagnosis not present

## 2024-01-21 DIAGNOSIS — L910 Hypertrophic scar: Secondary | ICD-10-CM | POA: Diagnosis present

## 2024-01-21 NOTE — Progress Notes (Signed)
 Diagnosis: L91.0 Hypertrophic scar   First Treatment Date: 2023-11-01 Last Treatment Date: 2023-11-15   Plan Name: Chest_R Site: Chest, Right  Pain: Denies Skin: Denies        BP 109/71 (BP Location: Left Arm, Patient Position: Sitting, Cuff Size: Normal)   Pulse 77   Temp 98.3 F (36.8 C) (Oral)   Resp 18   Ht 5' 4 (1.626 m)   Wt 172 lb 6.4 oz (78.2 kg)   SpO2 96%   BMI 29.59 kg/m

## 2024-01-21 NOTE — Progress Notes (Shared)
°  Radiation Oncology         [phone] ________________________________  Name: Monica Bradley MRN: [id number]  Date: 01/21/2024  DOB: May 09, 1985  Follow-Up Visit Note  CC: Patient, No Pcp Per  No ref. provider found  Diagnosis and Prior Radiotherapy:       ICD-10-CM   1. Keloid  L91.0        ==========DELIVERED PLANS==========  First Treatment Date: 2023-11-01 Last Treatment Date: 2023-11-15   Plan Name: Chest_R Site: Chest, Right Technique: Electron Mode: Electron Dose Per Fraction: 7.5 Gy Prescribed Dose (Delivered / Prescribed): 37.5 Gy / 37.5 Gy Prescribed Fxs (Delivered / Prescribed): 5 / 5  Breast keloid; s/p definitive radiation completed on 11/15/2023  CHIEF COMPLAINT:  Here for follow-up and surveillance of chest keloid  Narrative:  The patient returns today for routine follow-up. She finished her treatment approximately 2 months ago.                     Patient reports to be doing well overall. She denies any pain or pruritus to the treatment field. She denies any specific changes ***  ALLERGIES:  has no known allergies.  Meds: Current Outpatient Medications  Medication Sig Dispense Refill   folic acid (FOLVITE) 1 MG tablet Take 1 mg by mouth daily.     Prenatal Vit-Fe Fumarate-FA (MULTIVITAMIN-PRENATAL) 27-0.8 MG TABS tablet Take 1 tablet by mouth daily at 12 noon.     No current facility-administered medications for this encounter.    Physical Findings: The patient is in no acute distress. Patient is alert and oriented. Wt Readings from Last 3 Encounters:  01/21/24 172 lb 6.4 oz (78.2 kg)  07/16/23 164 lb 12.8 oz (74.8 kg)  12/21/19 164 lb (74.4 kg)    height is 5' 4 (1.626 m) and weight is 172 lb 6.4 oz (78.2 kg). Her oral temperature is 98.3 F (36.8 C). Her blood pressure is 109/71 and her pulse is 77. Her respiration is 18 and oxygen saturation is 96%. SABRA   Keloid with surrounding hyperpigmentation. Keloid edges are less defined however it  has not changed significantly in size or shape since prior to treatment.       Lab Findings: No results found for: WBC, HGB, HCT, MCV, PLT  No results found for: TSH  Radiographic Findings: No results found.  Impression/Plan:  Breast keloid; s/p definitive radiation completed on 11/15/2023  Patient has healed well from the effects of her radiation treatment.   Radiation follow-up PRN. We appreciate the opportunity to take part in this patient's care. She was encouraged to call with any questions or concerns.   On date of service, in total, we spent 20 minutes on this encounter. Patient was seen in person. _____________________________________    Leeroy Due, PA-C   Lauraine Golden, MD    [facility]  Radiation Oncology Direct Dial: [phone]  Fax: [phone] [email or web address]
